# Patient Record
Sex: Male | Born: 1985 | Race: Black or African American | Hispanic: No | Marital: Single | Smoking: Current some day smoker
Health system: Southern US, Community
[De-identification: ages and names within clinical notes are randomized; demographics above are authoritative.]

## PROBLEM LIST (undated history)

## (undated) DIAGNOSIS — F1290 Cannabis use, unspecified, uncomplicated: Secondary | ICD-10-CM

## (undated) DIAGNOSIS — R112 Nausea with vomiting, unspecified: Secondary | ICD-10-CM

## (undated) DIAGNOSIS — R109 Unspecified abdominal pain: Secondary | ICD-10-CM

## (undated) DIAGNOSIS — G8929 Other chronic pain: Secondary | ICD-10-CM

---

## 2010-10-09 ENCOUNTER — Emergency Department (HOSPITAL_COMMUNITY)
Admission: EM | Admit: 2010-10-09 | Discharge: 2010-10-09 | Disposition: A | Discharge status: Home / Self Care | Payer: Self-pay | Attending: Emergency Medicine | Admitting: Emergency Medicine

## 2010-10-09 DIAGNOSIS — G8929 Other chronic pain: Secondary | ICD-10-CM | POA: Insufficient documentation

## 2010-10-09 DIAGNOSIS — R079 Chest pain, unspecified: Secondary | ICD-10-CM | POA: Insufficient documentation

## 2010-10-09 DIAGNOSIS — R109 Unspecified abdominal pain: Secondary | ICD-10-CM | POA: Insufficient documentation

## 2010-11-08 ENCOUNTER — Emergency Department (HOSPITAL_COMMUNITY)
Admission: EM | Admit: 2010-11-08 | Discharge: 2010-11-08 | Disposition: A | Discharge status: Home / Self Care | Payer: Self-pay | Attending: Emergency Medicine | Admitting: Emergency Medicine

## 2010-11-08 DIAGNOSIS — R109 Unspecified abdominal pain: Secondary | ICD-10-CM | POA: Insufficient documentation

## 2010-11-08 DIAGNOSIS — N342 Other urethritis: Secondary | ICD-10-CM | POA: Insufficient documentation

## 2010-11-08 DIAGNOSIS — R112 Nausea with vomiting, unspecified: Secondary | ICD-10-CM | POA: Insufficient documentation

## 2010-11-08 DIAGNOSIS — F411 Generalized anxiety disorder: Secondary | ICD-10-CM | POA: Insufficient documentation

## 2010-11-08 LAB — COMPREHENSIVE METABOLIC PANEL
ALT: 11 U/L (ref 0–53)
AST: 24 U/L (ref 0–37)
Albumin: 4.5 g/dL (ref 3.5–5.2)
Alkaline Phosphatase: 85 U/L (ref 39–117)
CO2: 25 mEq/L (ref 19–32)
Chloride: 100 mEq/L (ref 96–112)
Creatinine, Ser: 0.6 mg/dL (ref 0.50–1.35)
GFR calc non Af Amer: >60 mL/min (ref >60)
Potassium: 4.1 mEq/L (ref 3.5–5.1)
Sodium: 139 mEq/L (ref 135–145)
Total Bilirubin: 1.1 mg/dL (ref 0.3–1.2)

## 2010-11-08 LAB — DIFFERENTIAL
Basophils Absolute: 0 10*3/uL (ref 0.0–0.1)
Basophils Relative: 0 % (ref 0–1)
Eosinophils Absolute: 0 10*3/uL (ref 0.0–0.7)
Lymphocytes Relative: 13 % (ref 12–46)
Monocytes Relative: 11 % (ref 3–12)
Neutro Abs: 9.8 10*3/uL — ABNORMAL HIGH (ref 1.7–7.7)
Neutrophils Relative %: 76 % (ref 43–77)

## 2010-11-08 LAB — URINALYSIS, ROUTINE W REFLEX MICROSCOPIC
Glucose, UA: NEGATIVE mg/dL
Hgb urine dipstick: NEGATIVE
Ketones, ur: 40 mg/dL — AB
Protein, ur: 100 mg/dL — AB
Urobilinogen, UA: 1 mg/dL (ref 0.0–1.0)

## 2010-11-08 LAB — CBC
Hemoglobin: 17.2 g/dL — ABNORMAL HIGH (ref 13.0–17.0)
MCH: 27.6 pg (ref 26.0–34.0)
Platelets: 237 10*3/uL (ref 150–400)
RBC: 6.24 MIL/uL — ABNORMAL HIGH (ref 4.22–5.81)
WBC: 12.9 10*3/uL — ABNORMAL HIGH (ref 4.0–10.5)

## 2010-11-08 LAB — RAPID URINE DRUG SCREEN, HOSP PERFORMED
Amphetamines: NOT DETECTED
Benzodiazepines: NOT DETECTED
Opiates: NOT DETECTED
Tetrahydrocannabinol: POSITIVE — AB

## 2010-11-08 LAB — URINE MICROSCOPIC-ADD ON

## 2010-11-28 ENCOUNTER — Inpatient Hospital Stay (INDEPENDENT_AMBULATORY_CARE_PROVIDER_SITE_OTHER): Admit: 2010-11-28 | Discharge: 2010-11-28 | Disposition: A | Discharge status: Home / Self Care | Payer: Self-pay

## 2010-11-28 ENCOUNTER — Emergency Department (HOSPITAL_COMMUNITY)
Admission: EM | Admit: 2010-11-28 | Discharge: 2010-11-28 | Disposition: A | Discharge status: Home / Self Care | Payer: Self-pay | Attending: Emergency Medicine | Admitting: Emergency Medicine

## 2010-11-28 DIAGNOSIS — K047 Periapical abscess without sinus: Secondary | ICD-10-CM

## 2010-11-28 DIAGNOSIS — K122 Cellulitis and abscess of mouth: Secondary | ICD-10-CM | POA: Insufficient documentation

## 2010-11-29 ENCOUNTER — Emergency Department (HOSPITAL_COMMUNITY): Payer: Self-pay

## 2010-11-29 ENCOUNTER — Emergency Department (HOSPITAL_COMMUNITY)
Admission: EM | Admit: 2010-11-29 | Discharge: 2010-11-30 | Disposition: A | Discharge status: Home / Self Care | Payer: Self-pay | Attending: Emergency Medicine | Admitting: Emergency Medicine

## 2010-11-29 ENCOUNTER — Encounter (HOSPITAL_COMMUNITY): Payer: Self-pay | Admitting: Radiology

## 2010-11-29 DIAGNOSIS — R220 Localized swelling, mass and lump, head: Secondary | ICD-10-CM | POA: Insufficient documentation

## 2010-11-29 DIAGNOSIS — K089 Disorder of teeth and supporting structures, unspecified: Secondary | ICD-10-CM | POA: Insufficient documentation

## 2010-11-29 DIAGNOSIS — K047 Periapical abscess without sinus: Secondary | ICD-10-CM | POA: Insufficient documentation

## 2010-11-29 LAB — BASIC METABOLIC PANEL
CO2: 28 mEq/L (ref 19–32)
Calcium: 9.3 mg/dL (ref 8.4–10.5)
Creatinine, Ser: 0.64 mg/dL (ref 0.50–1.35)
GFR calc non Af Amer: >60 mL/min (ref >60)
Glucose, Bld: 84 mg/dL (ref 70–99)

## 2010-11-29 LAB — CBC
MCH: 27.1 pg (ref 26.0–34.0)
MCHC: 33 g/dL (ref 30.0–36.0)
MCV: 81.9 fL (ref 78.0–100.0)
Platelets: 182 10*3/uL (ref 150–400)
RBC: 5.69 MIL/uL (ref 4.22–5.81)

## 2010-11-29 LAB — DIFFERENTIAL
Eosinophils Absolute: 0.1 10*3/uL (ref 0.0–0.7)
Lymphs Abs: 1.9 10*3/uL (ref 0.7–4.0)
Monocytes Absolute: 1.4 10*3/uL — ABNORMAL HIGH (ref 0.1–1.0)
Monocytes Relative: 13 % — ABNORMAL HIGH (ref 3–12)
Neutrophils Relative %: 67 % (ref 43–77)

## 2010-11-29 MED ORDER — IOHEXOL 300 MG/ML  SOLN
70.0000 mL | Freq: Once | INTRAMUSCULAR | Status: AC | PRN
  Administered 2010-11-29: 70 mL via INTRAVENOUS

## 2011-02-18 ENCOUNTER — Emergency Department (HOSPITAL_COMMUNITY)
Admission: EM | Admit: 2011-02-18 | Discharge: 2011-02-18 | Disposition: A | Discharge status: Home / Self Care | Payer: Self-pay | Attending: Emergency Medicine | Admitting: Emergency Medicine

## 2011-02-18 ENCOUNTER — Encounter (HOSPITAL_COMMUNITY): Payer: Self-pay | Admitting: *Deleted

## 2011-02-18 DIAGNOSIS — R109 Unspecified abdominal pain: Secondary | ICD-10-CM | POA: Insufficient documentation

## 2011-02-18 DIAGNOSIS — R10819 Abdominal tenderness, unspecified site: Secondary | ICD-10-CM | POA: Insufficient documentation

## 2011-02-18 DIAGNOSIS — R110 Nausea: Secondary | ICD-10-CM | POA: Insufficient documentation

## 2011-02-18 LAB — CBC
MCH: 27.7 pg (ref 26.0–34.0)
MCV: 77.9 fL — ABNORMAL LOW (ref 78.0–100.0)
Platelets: 230 10*3/uL (ref 150–400)
RDW: 12.7 % (ref 11.5–15.5)

## 2011-02-18 LAB — DIFFERENTIAL
Basophils Absolute: 0.1 10*3/uL (ref 0.0–0.1)
Basophils Relative: 1 % (ref 0–1)
Eosinophils Absolute: 0 10*3/uL (ref 0.0–0.7)
Eosinophils Relative: 0 % (ref 0–5)

## 2011-02-18 LAB — COMPREHENSIVE METABOLIC PANEL
ALT: 9 U/L (ref 0–53)
Alkaline Phosphatase: 112 U/L (ref 39–117)
BUN: 20 mg/dL (ref 6–23)
GFR calc Af Amer: >90 mL/min (ref >90)
Total Bilirubin: 2 mg/dL — ABNORMAL HIGH (ref 0.3–1.2)

## 2011-02-18 LAB — LACTIC ACID, PLASMA: Lactic Acid, Venous: 1.4 mmol/L (ref 0.5–2.2)

## 2011-02-18 LAB — LIPASE, BLOOD: Lipase: 17 U/L (ref 11–59)

## 2011-02-18 MED ORDER — HYDROMORPHONE HCL PF 2 MG/ML IJ SOLN
2.0000 mg | Freq: Once | INTRAMUSCULAR | Status: AC
  Administered 2011-02-18: 2 mg via INTRAMUSCULAR

## 2011-02-18 MED ORDER — HYDROMORPHONE HCL PF 2 MG/ML IJ SOLN
2.0000 mg | Freq: Once | INTRAMUSCULAR | Status: AC
  Administered 2011-02-18: 2 mg via INTRAMUSCULAR
  Filled 2011-02-18: qty 1

## 2011-02-18 MED ORDER — ONDANSETRON 4 MG PO TBDP
4.0000 mg | ORAL_TABLET | Freq: Once | ORAL | Status: AC
  Administered 2011-02-18: 4 mg via ORAL
  Filled 2011-02-18: qty 1

## 2011-02-18 MED ORDER — ONDANSETRON HCL 4 MG PO TABS: 8.0000 mg | ORAL_TABLET | Freq: Two times a day (BID) | ORAL | Status: AC | PRN

## 2011-02-18 MED ORDER — ONDANSETRON HCL 4 MG/2ML IJ SOLN
INTRAMUSCULAR | Status: AC
  Administered 2011-02-18: 4 mg via INTRAMUSCULAR
  Filled 2011-02-18: qty 2

## 2011-02-18 MED ORDER — HYDROCODONE-ACETAMINOPHEN 5-325 MG PO TABS: 2.0000 | ORAL_TABLET | ORAL | Status: AC | PRN

## 2011-02-18 MED ORDER — HYDROMORPHONE HCL PF 2 MG/ML IJ SOLN
2.0000 mg | Freq: Once | INTRAMUSCULAR | Status: DC
  Filled 2011-02-18: qty 1

## 2011-02-18 MED ORDER — ONDANSETRON 8 MG PO TBDP
8.0000 mg | ORAL_TABLET | Freq: Once | ORAL | Status: AC
  Administered 2011-02-18: 8 mg via ORAL
  Filled 2011-02-18: qty 1

## 2011-02-18 NOTE — ED Notes (Signed)
Per EMS, pt c/o lower abdominal pain and states has been having these episodes for 10 years.

## 2011-02-18 NOTE — ED Provider Notes (Signed)
History     CSN: [id number] Arrival date & time: 02/18/2011  5:28 PM   First MD Initiated Contact with Patient 02/18/11 1809      Chief Complaint  Patient presents with  . Abdominal Pain    (Consider location/radiation/quality/duration/timing/severity/associated sxs/prior treatment) HPI The patient presents with 5 days of abdominal pain, nausea. He notes a history of greater than 10 years of similar pain. This episode began gradually, since onset has been progressive. The pain is diffuse, abdominal, crampy. Pain is not relieved by anything, the patient is now intolerant of by mouth, both medications and food. The pain is worse with motion and activity. No dyspnea, no chest pain, no dysuria, no fever, no chills. The patient notes ER visits every 2-3 months for similar pain. Past Medical History  Diagnosis Date  . Abdominal pain     History reviewed. No pertinent past surgical history.  No family history on file.  History  Substance Use Topics  . Smoking status: Not on file  . Smokeless tobacco: Not on file  . Alcohol Use:       Review of Systems Gen: Per HPI HEENT: No HA CV: No CP Resp: No dyspnea Abd: Per HPI, otherwise negative Musk: Per HPI, otherwise negative Neuro: No dysesthesia, or focal changes GU: Per HPI, otherwise negative Skin: Neg Psych: Neg  Allergies  Morphine and related; Vicodin; and Protonix  Home Medications  No current outpatient prescriptions on file.  BP 170/89  Pulse 79  Temp(Src) 98.7 F (37.1 C) (Oral)  Resp 16  SpO2 100%  Physical Exam  Constitutional: He is oriented to person, place, and time. He appears well-developed and well-nourished. He appears distressed.  HENT:  Head: Normocephalic and atraumatic.  Eyes: Conjunctivae are normal. Pupils are equal, round, and reactive to light.  Neck: Neck supple.  Cardiovascular: Normal rate and regular rhythm.   Pulmonary/Chest: Effort normal and breath sounds normal.  Abdominal:  Soft. Normal appearance. There is generalized tenderness. There is guarding. There is no rigidity.  Musculoskeletal: He exhibits no edema and no tenderness.  Neurological: He is alert and oriented to person, place, and time. No cranial nerve deficit.  Skin: Skin is warm and dry.  Psychiatric: He has a normal mood and affect.    ED Course  Procedures (including critical care time)   Labs Reviewed  CBC  DIFFERENTIAL  LACTIC ACID, PLASMA  COMPREHENSIVE METABOLIC PANEL  LIPASE, BLOOD   No results found.   No diagnosis found.    MDM  This 25 year old male with history of chronic abdominal pain now presents with worsening pain for the past several days. On exam the patient is uncomfortable appearing. Following several boluses of IM narcotics, the patient was significantly more comfortable.  The patient's laboratory evaluation, does not demonstrate acute findings. Given his absence of acute findings, patient's stable vital signs, and is significant pertinent with analgesics, this presentation is most consistent with acute on chronic pain The patient will be discharged to continue followup with his specialists.        Gerhard Munch, MD 02/18/11 2221

## 2011-02-18 NOTE — ED Notes (Signed)
JYN:[id number]<AO> Expected date:02/18/11<BR> Expected time: 5:20 PM<BR> Means of arrival:Ambulance<BR> Comments:<BR> EMS 10 GC : abd pain

## 2011-02-21 ENCOUNTER — Emergency Department (HOSPITAL_COMMUNITY): Payer: Self-pay

## 2011-02-21 ENCOUNTER — Encounter (HOSPITAL_COMMUNITY): Payer: Self-pay

## 2011-02-21 ENCOUNTER — Emergency Department (HOSPITAL_COMMUNITY)
Admission: EM | Admit: 2011-02-21 | Discharge: 2011-02-21 | Disposition: A | Discharge status: Home / Self Care | Payer: Self-pay | Attending: Emergency Medicine | Admitting: Emergency Medicine

## 2011-02-21 DIAGNOSIS — N390 Urinary tract infection, site not specified: Secondary | ICD-10-CM | POA: Insufficient documentation

## 2011-02-21 DIAGNOSIS — R109 Unspecified abdominal pain: Secondary | ICD-10-CM | POA: Insufficient documentation

## 2011-02-21 DIAGNOSIS — R112 Nausea with vomiting, unspecified: Secondary | ICD-10-CM | POA: Insufficient documentation

## 2011-02-21 DIAGNOSIS — R10819 Abdominal tenderness, unspecified site: Secondary | ICD-10-CM | POA: Insufficient documentation

## 2011-02-21 LAB — DIFFERENTIAL
Basophils Absolute: 0.1 10*3/uL (ref 0.0–0.1)
Basophils Relative: 1 % (ref 0–1)
Eosinophils Absolute: 0 10*3/uL (ref 0.0–0.7)
Eosinophils Relative: 0 % (ref 0–5)
Monocytes Absolute: 1.2 10*3/uL — ABNORMAL HIGH (ref 0.1–1.0)

## 2011-02-21 LAB — LACTIC ACID, PLASMA: Lactic Acid, Venous: 1.7 mmol/L (ref 0.5–2.2)

## 2011-02-21 LAB — COMPREHENSIVE METABOLIC PANEL
ALT: 8 U/L (ref 0–53)
AST: 14 U/L (ref 0–37)
CO2: 29 mEq/L (ref 19–32)
Calcium: 10.2 mg/dL (ref 8.4–10.5)
Creatinine, Ser: 0.78 mg/dL (ref 0.50–1.35)
GFR calc non Af Amer: >90 mL/min (ref >90)
Sodium: 133 mEq/L — ABNORMAL LOW (ref 135–145)
Total Protein: 9 g/dL — ABNORMAL HIGH (ref 6.0–8.3)

## 2011-02-21 LAB — URINALYSIS, ROUTINE W REFLEX MICROSCOPIC
Glucose, UA: NEGATIVE mg/dL
Hgb urine dipstick: NEGATIVE
Protein, ur: 100 mg/dL — AB
Specific Gravity, Urine: 1.035 — ABNORMAL HIGH (ref 1.005–1.030)
Urobilinogen, UA: 1 mg/dL (ref 0.0–1.0)

## 2011-02-21 LAB — CBC
HCT: 57.1 % — ABNORMAL HIGH (ref 39.0–52.0)
MCH: 27.6 pg (ref 26.0–34.0)
MCHC: 35.7 g/dL (ref 30.0–36.0)
MCV: 77.4 fL — ABNORMAL LOW (ref 78.0–100.0)
Platelets: 225 10*3/uL (ref 150–400)
RDW: 12.5 % (ref 11.5–15.5)
WBC: 8 10*3/uL (ref 4.0–10.5)

## 2011-02-21 LAB — URINE MICROSCOPIC-ADD ON

## 2011-02-21 MED ORDER — HYDROMORPHONE HCL PF 1 MG/ML IJ SOLN
1.0000 mg | Freq: Once | INTRAMUSCULAR | Status: AC
  Administered 2011-02-21: 1 mg via INTRAVENOUS

## 2011-02-21 MED ORDER — CEFTRIAXONE SODIUM 1 G IJ SOLR
1.0000 g | Freq: Once | INTRAMUSCULAR | Status: AC
  Administered 2011-02-21: 0.25 g via INTRAMUSCULAR
  Filled 2011-02-21: qty 10

## 2011-02-21 MED ORDER — IOHEXOL 300 MG/ML  SOLN
100.0000 mL | Freq: Once | INTRAMUSCULAR | Status: AC | PRN
  Administered 2011-02-21: 100 mL via INTRAVENOUS

## 2011-02-21 MED ORDER — DROPERIDOL 2.5 MG/ML IJ SOLN: 2.5000 mg | Freq: Once | INTRAMUSCULAR | Status: DC

## 2011-02-21 MED ORDER — METOCLOPRAMIDE HCL 5 MG/ML IJ SOLN
10.0000 mg | Freq: Once | INTRAMUSCULAR | Status: AC
  Administered 2011-02-21: 10 mg via INTRAVENOUS
  Filled 2011-02-21: qty 2

## 2011-02-21 MED ORDER — HYDROMORPHONE HCL PF 2 MG/ML IJ SOLN
INTRAMUSCULAR | Status: AC
  Filled 2011-02-21: qty 1

## 2011-02-21 MED ORDER — FENTANYL CITRATE 0.05 MG/ML IJ SOLN
100.0000 ug | Freq: Once | INTRAMUSCULAR | Status: AC
  Administered 2011-02-21: 100 ug via INTRAVENOUS
  Filled 2011-02-21: qty 2

## 2011-02-21 MED ORDER — AZITHROMYCIN 250 MG PO TABS
1000.0000 mg | ORAL_TABLET | Freq: Once | ORAL | Status: AC
  Administered 2011-02-21: 1000 mg via ORAL
  Filled 2011-02-21: qty 1

## 2011-02-21 MED ORDER — CEPHALEXIN 500 MG PO CAPS: 500.0000 mg | ORAL_CAPSULE | Freq: Four times a day (QID) | ORAL | Status: AC

## 2011-02-21 MED ORDER — SODIUM CHLORIDE 0.9 % IV BOLUS (SEPSIS)
1000.0000 mL | Freq: Once | INTRAVENOUS | Status: AC
  Administered 2011-02-21: 1000 mL via INTRAVENOUS

## 2011-02-21 MED ORDER — ONDANSETRON HCL 4 MG/2ML IJ SOLN
4.0000 mg | Freq: Once | INTRAMUSCULAR | Status: AC
  Administered 2011-02-21: 4 mg via INTRAVENOUS
  Filled 2011-02-21: qty 2

## 2011-02-21 MED ORDER — LIDOCAINE HCL 1 % IJ SOLN
INTRAMUSCULAR | Status: AC
  Administered 2011-02-21: 20 mL
  Filled 2011-02-21: qty 20

## 2011-02-21 NOTE — ED Notes (Signed)
Patient transported to CT 

## 2011-02-21 NOTE — ED Notes (Signed)
ZOX:[id number]<AV> Expected date:02/21/11<BR> Expected time: 6:14 AM<BR> Means of arrival:Ambulance<BR> Comments:<BR>

## 2011-02-21 NOTE — ED Notes (Signed)
Attempted twice to collect urine. Patient states "I'm in too much pain right now. The pain medicine didn't work." Occupational hygienist, [place].

## 2011-02-21 NOTE — ED Provider Notes (Signed)
Medical screening examination/treatment/procedure(s) were performed by non-physician practitioner and as supervising physician I was immediately available for consultation/collaboration.   Glynn Octave, MD 02/21/11 [phone]

## 2011-02-21 NOTE — ED Notes (Signed)
Pt with c/o abd pain since last Monday. Pt reports n/v, denies diarrhea. Pt was seen here on Saturday for same and d/c with rx for Phenergan and Vicodin. Pt states the Phenergan makes him feel worse, and the Vicodin is not relieving his pain. Pt states he has had this problem for the past 10 years and no one has been able to diagnose his pain.

## 2011-02-21 NOTE — ED Provider Notes (Signed)
History     CSN: [id number] Arrival date & time: 02/21/2011  6:38 AM   First MD Initiated Contact with Patient 02/21/11 [phone]      Chief Complaint  Patient presents with  . Abdominal Pain    Pt with c/o abd pain since last Monday. Was seen here on Saturday for same. Reports n/v   HPI Patient presents to the emergency room with complaint of abdominal pain that is diffuse in nature. Patient reports that he has associated nausea and vomiting. States that he has been unable to eat anything for one week. Patient states that   Past Medical History  Diagnosis Date  . Abdominal pain     History reviewed. No pertinent past surgical history.  No family history on file.  History  Substance Use Topics  . Smoking status: Never Smoker   . Smokeless tobacco: Not on file  . Alcohol Use: No      Review of Systems  Constitutional: Negative for fever, chills, diaphoresis and appetite change.  HENT: Negative for neck pain.   Eyes: Negative for photophobia and visual disturbance.  Respiratory: Negative for cough, chest tightness and shortness of breath.   Cardiovascular: Negative for chest pain.  Gastrointestinal: Negative for nausea, vomiting and abdominal pain.  Genitourinary: Negative for flank pain.  Musculoskeletal: Negative for back pain.  Skin: Negative for rash.  Neurological: Negative for weakness and numbness.  All other systems reviewed and are negative.    Allergies  Morphine and related; Vicodin; and Protonix  Home Medications   Current Outpatient Rx  Name Route Sig Dispense Refill  . HYDROCODONE-ACETAMINOPHEN 5-325 MG PO TABS Oral Take 2 tablets by mouth every 4 (four) hours as needed for pain. 15 tablet 0  . ONDANSETRON HCL 4 MG PO TABS Oral Take 2 tablets (8 mg total) by mouth every 12 (twelve) hours as needed for nausea. 15 tablet 0    BP 134/84  Pulse 99  Temp(Src) 99.2 F (37.3 C) (Oral)  Resp 24  SpO2 100%  Physical Exam  Nursing note and vitals  reviewed. Constitutional: He is oriented to person, place, and time. He appears well-developed and well-nourished. No distress.  HENT:  Head: Normocephalic and atraumatic.  Eyes: EOM are normal. Pupils are equal, round, and reactive to light.  Neck: Normal range of motion. Neck supple.  Cardiovascular: Normal rate, regular rhythm, normal heart sounds and intact distal pulses.  Exam reveals no gallop and no friction rub.   No murmur heard. Abdominal: Soft. Bowel sounds are normal. He exhibits no distension and no mass. There is tenderness in the suprapubic area. There is guarding. There is no rigidity, no rebound, no CVA tenderness, no tenderness at McBurney's point and negative Murphy's sign. No hernia.  Genitourinary: Penis normal.       Chaperone present during examination.  Musculoskeletal: Normal range of motion.  Neurological: He is alert and oriented to person, place, and time.  Skin: Skin is warm and dry. No rash noted. He is not diaphoretic. No erythema. No pallor.    ED Course  Procedures (including critical care time)  Patient seen and evaluated.  VSS reviewed. . Nursing notes reviewed. Discussed with attending physician, Dr. Manus Gunning. Initial testing ordered. Will monitor the patient closely. They agree with the treatment plan and diagnosis.   Results for orders placed during the hospital encounter of 02/21/11  CBC      Component Value Range   WBC 8.0  4.0 - 10.5 (K/uL)   RBC  7.38 (*) 4.22 - 5.81 (MIL/uL)   Hemoglobin 20.4 (*) 13.0 - 17.0 (g/dL)   HCT 16.1 (*) 09.6 - 52.0 (%)   MCV 77.4 (*) 78.0 - 100.0 (fL)   MCH 27.6  26.0 - 34.0 (pg)   MCHC 35.7  30.0 - 36.0 (g/dL)   RDW 04.5  40.9 - 81.1 (%)   Platelets 225  150 - 400 (K/uL)  DIFFERENTIAL      Component Value Range   Neutrophils Relative 61  43 - 77 (%)   Neutro Abs 4.9  1.7 - 7.7 (K/uL)   Lymphocytes Relative 23  12 - 46 (%)   Lymphs Abs 1.9  0.7 - 4.0 (K/uL)   Monocytes Relative 14 (*) 3 - 12 (%)   Monocytes  Absolute 1.2 (*) 0.1 - 1.0 (K/uL)   Eosinophils Relative 0  0 - 5 (%)   Eosinophils Absolute 0.0  0.0 - 0.7 (K/uL)   Basophils Relative 1  0 - 1 (%)   Basophils Absolute 0.1  0.0 - 0.1 (K/uL)  COMPREHENSIVE METABOLIC PANEL      Component Value Range   Sodium 133 (*) 135 - 145 (mEq/L)   Potassium 3.4 (*) 3.5 - 5.1 (mEq/L)   Chloride 89 (*) 96 - 112 (mEq/L)   CO2 29  19 - 32 (mEq/L)   Glucose, Bld 113 (*) 70 - 99 (mg/dL)   BUN 20  6 - 23 (mg/dL)   Creatinine, Ser 9.14  0.50 - 1.35 (mg/dL)   Calcium 78.2  8.4 - 10.5 (mg/dL)   Total Protein 9.0 (*) 6.0 - 8.3 (g/dL)   Albumin 4.8  3.5 - 5.2 (g/dL)   AST 14  0 - 37 (U/L)   ALT 8  0 - 53 (U/L)   Alkaline Phosphatase 112  39 - 117 (U/L)   Total Bilirubin 2.7 (*) 0.3 - 1.2 (mg/dL)   GFR calc non Af Amer >90  >90 (mL/min)   GFR calc Af Amer >90  >90 (mL/min)  LIPASE, BLOOD      Component Value Range   Lipase 21  11 - 59 (U/L)  URINALYSIS, ROUTINE W REFLEX MICROSCOPIC      Component Value Range   Color, Urine AMBER (*) YELLOW    Appearance CLEAR  CLEAR    Specific Gravity, Urine 1.035 (*) 1.005 - 1.030    pH 6.5  5.0 - 8.0    Glucose, UA NEGATIVE  NEGATIVE (mg/dL)   Hgb urine dipstick NEGATIVE  NEGATIVE    Bilirubin Urine SMALL (*) NEGATIVE    Ketones, ur >80 (*) NEGATIVE (mg/dL)   Protein, ur 956 (*) NEGATIVE (mg/dL)   Urobilinogen, UA 1.0  0.0 - 1.0 (mg/dL)   Nitrite NEGATIVE  NEGATIVE    Leukocytes, UA SMALL (*) NEGATIVE   LACTIC ACID, PLASMA      Component Value Range   Lactic Acid, Venous 1.7  0.5 - 2.2 (mmol/L)  URINE MICROSCOPIC-ADD ON      Component Value Range   Squamous Epithelial / LPF FEW (*) RARE    WBC, UA 7-10  <3 (WBC/hpf)   Bacteria, UA FEW (*) RARE    Urine-Other MUCOUS PRESENT     Ct Abdomen Pelvis W Contrast  02/21/2011  *RADIOLOGY REPORT*  Clinical Data: Diffuse mid and lower abdominal pain with nausea and vomiting.  CT ABDOMEN AND PELVIS WITH CONTRAST  Technique:  Multidetector CT imaging of the abdomen  and pelvis was performed following the standard protocol during bolus administration  of intravenous contrast.  Contrast: OMNIPAQUE IOHEXOL 300 MG/ML IV SOLN  Comparison: None.  Findings: The liver, gallbladder, spleen, pancreas, adrenal glands and kidneys are within normal limits.  Bowel loops are normal in caliber.  No acute inflammatory process is identified.  There is no evidence of appendicitis.  No abnormal fluid collections.  No hernias or abnormal calcifications.  The bladder is unremarkable.  Bony structures are unremarkable.  There are incidental mild posterior disc bulges at the L4-5 and L5-S1 levels of the lumbar spine.  IMPRESSION: No acute abnormalities.  Original Report Authenticated By: Reola Calkins, M.D.        7:25 AM spoke to dr Newman Nip, from [place] who states that he has had chronic abdominal pain for many years. Reports that he has had endoscopy/colonscopy/many CT scans of abdomen. Last CT scan was in May 2011.   Patient seen and evaluated.  VSS reviewed. . Nursing notes reviewed. Discussed with attending physician, Dr. Manus Gunning. Advised that the patient should have a CT scan. Initial testing ordered. Will monitor the patient closely. They agree with the treatment plan and diagnosis.    8:34 AM pain is not controlled with fentanyl, states that he has taken dilaudid without problems. Patient was standing in re-examination and in NAD.   10:08 AM went to re-evaluate the patient, he is currently in CT scan. Pending results.   11:38 AM will treat patient for STD/UTI considering the symptomatic UTI. Pain has resolved. Passed PO fluid challenge.  MDM  Abdominal pain        Demetrius Charity, [place] 02/21/11 1139

## 2011-02-21 NOTE — ED Notes (Signed)
Chief Complaint  Patient presents with  . Abdominal Pain    Pt with c/o abd pain since last Monday. Was seen here on Saturday for same. Reports n/v

## 2011-04-28 ENCOUNTER — Encounter (HOSPITAL_COMMUNITY): Payer: Self-pay | Admitting: *Deleted

## 2011-04-28 ENCOUNTER — Emergency Department (HOSPITAL_COMMUNITY)
Admission: EM | Admit: 2011-04-28 | Discharge: 2011-04-28 | Disposition: A | Discharge status: Home / Self Care | Payer: Self-pay | Attending: Emergency Medicine | Admitting: Emergency Medicine

## 2011-04-28 DIAGNOSIS — R109 Unspecified abdominal pain: Secondary | ICD-10-CM | POA: Insufficient documentation

## 2011-04-28 DIAGNOSIS — G8929 Other chronic pain: Secondary | ICD-10-CM | POA: Insufficient documentation

## 2011-04-28 DIAGNOSIS — R112 Nausea with vomiting, unspecified: Secondary | ICD-10-CM | POA: Insufficient documentation

## 2011-04-28 LAB — COMPREHENSIVE METABOLIC PANEL
ALT: 7 U/L (ref 0–53)
AST: 13 U/L (ref 0–37)
Albumin: 4.4 g/dL (ref 3.5–5.2)
Alkaline Phosphatase: 63 U/L (ref 39–117)
BUN: 8 mg/dL (ref 6–23)
CO2: 27 mEq/L (ref 19–32)
Calcium: 9.7 mg/dL (ref 8.4–10.5)
Chloride: 104 mEq/L (ref 96–112)
Creatinine, Ser: 0.75 mg/dL (ref 0.50–1.35)
GFR calc Af Amer: >90 mL/min (ref >90)
GFR calc non Af Amer: >90 mL/min (ref >90)
Glucose, Bld: 90 mg/dL (ref 70–99)
Potassium: 3.7 mEq/L (ref 3.5–5.1)
Sodium: 141 mEq/L (ref 135–145)
Total Bilirubin: 1 mg/dL (ref 0.3–1.2)
Total Protein: 7.4 g/dL (ref 6.0–8.3)

## 2011-04-28 LAB — URINALYSIS, ROUTINE W REFLEX MICROSCOPIC
Hgb urine dipstick: NEGATIVE
Ketones, ur: 15 mg/dL — AB
Specific Gravity, Urine: 1.027 (ref 1.005–1.030)
pH: 7 (ref 5.0–8.0)

## 2011-04-28 LAB — CBC
MCHC: 33.7 g/dL (ref 30.0–36.0)
Platelets: 196 10*3/uL (ref 150–400)
RDW: 13.3 % (ref 11.5–15.5)

## 2011-04-28 LAB — DIFFERENTIAL
Basophils Absolute: 0.1 10*3/uL (ref 0.0–0.1)
Basophils Relative: 1 % (ref 0–1)
Monocytes Absolute: 0.6 10*3/uL (ref 0.1–1.0)
Neutro Abs: 2.7 10*3/uL (ref 1.7–7.7)
Neutrophils Relative %: 50 % (ref 43–77)

## 2011-04-28 LAB — LIPASE, BLOOD: Lipase: 16 U/L (ref 11–59)

## 2011-04-28 MED ORDER — SODIUM CHLORIDE 0.9 % IV BOLUS (SEPSIS)
1000.0000 mL | Freq: Once | INTRAVENOUS | Status: AC
  Administered 2011-04-28: 1000 mL via INTRAVENOUS

## 2011-04-28 MED ORDER — HYDROMORPHONE HCL PF 1 MG/ML IJ SOLN
1.0000 mg | Freq: Once | INTRAMUSCULAR | Status: AC
  Administered 2011-04-28: 1 mg via INTRAVENOUS
  Filled 2011-04-28: qty 1

## 2011-04-28 MED ORDER — ONDANSETRON HCL 4 MG PO TABS: 4.0000 mg | ORAL_TABLET | Freq: Four times a day (QID) | ORAL | Status: AC

## 2011-04-28 MED ORDER — ONDANSETRON HCL 4 MG/2ML IJ SOLN
4.0000 mg | Freq: Once | INTRAMUSCULAR | Status: AC
  Administered 2011-04-28: 4 mg via INTRAVENOUS
  Filled 2011-04-28: qty 2

## 2011-04-28 MED ORDER — OXYCODONE-ACETAMINOPHEN 5-325 MG PO TABS: 1.0000 | ORAL_TABLET | ORAL | Status: AC | PRN

## 2011-04-28 NOTE — ED Notes (Signed)
Pt noted alert and oriented. S/O at bedside. Skin warm and dry. VSS. Resp are unlabored. SR up. Call bell in reach.

## 2011-04-28 NOTE — ED Notes (Signed)
Pt reports pain improved 4/10. PO trial given with water.  Pt appears in no acute distress.

## 2011-04-28 NOTE — ED Notes (Signed)
The pt hasha d abd and chest pain for 9 years.  He has had more pain for 4 weeks.  He does not live here he lives in [place] and his doxtors are there.  There is no diagnosis yet

## 2011-04-28 NOTE — ED Notes (Signed)
Pt tolerated po trial. Feels well enough to go home.

## 2011-04-28 NOTE — ED Provider Notes (Signed)
History     CSN: [id number]  Arrival date & time 04/28/11  1639   First MD Initiated Contact with Patient 04/28/11 1802      Chief Complaint  Patient presents with  . Abdominal Pain    (Consider location/radiation/quality/duration/timing/severity/associated sxs/prior treatment) Patient is a 26 y.o. male presenting with abdominal pain. The history is provided by the patient.  Abdominal Pain The primary symptoms of the illness include abdominal pain, nausea and vomiting. The primary symptoms of the illness do not include fever, shortness of breath or diarrhea. Episode onset: 3 weeks.  acute on chronic. The onset of the illness was gradual. The problem has been gradually worsening.  Onset: 3 weeks. The pain came on gradually. The abdominal pain has been gradually worsening since its onset. Pain Location: Lower abdomen. The abdominal pain does not radiate. Pain scale: Severe. The abdominal pain is relieved by nothing. The abdominal pain is exacerbated by vomiting.  The patient has not had a change in bowel habit. Additional symptoms associated with the illness include anorexia. Symptoms associated with the illness do not include diaphoresis or constipation.    Past Medical History  Diagnosis Date  . Abdominal pain     History reviewed. No pertinent past surgical history.  No family history on file.  History  Substance Use Topics  . Smoking status: Never Smoker   . Smokeless tobacco: Not on file  . Alcohol Use: No      Review of Systems  Constitutional: Negative for fever and diaphoresis.  HENT: Negative for congestion, facial swelling and trouble swallowing.   Respiratory: Negative for cough and shortness of breath.   Cardiovascular: Negative for chest pain.  Gastrointestinal: Positive for nausea, vomiting, abdominal pain and anorexia. Negative for diarrhea and constipation.  Genitourinary: Negative for difficulty urinating.  Skin: Negative for rash.  All other systems  reviewed and are negative.    Allergies  Morphine and related; Vicodin; and Protonix  Home Medications  No current outpatient prescriptions on file.  BP 134/80  Pulse 64  Temp 98.7 F (37.1 C)  Resp 18  SpO2 98%  Physical Exam  Nursing note and vitals reviewed. Constitutional: He is oriented to person, place, and time. He appears well-developed and well-nourished. No distress.  HENT:  Head: Normocephalic and atraumatic.  Mouth/Throat: Oropharynx is clear and moist.  Eyes: Conjunctivae are normal. Pupils are equal, round, and reactive to light. No scleral icterus.  Neck: Normal range of motion. Neck supple.  Cardiovascular: Normal rate, regular rhythm, normal heart sounds and intact distal pulses.   No murmur heard. Pulmonary/Chest: Effort normal and breath sounds normal. No stridor. No respiratory distress. He has no wheezes. He has no rales.  Abdominal: Soft. He exhibits no distension. There is tenderness in the right lower quadrant, periumbilical area and left lower quadrant.  Musculoskeletal: Normal range of motion. He exhibits no edema.  Neurological: He is alert and oriented to person, place, and time.  Skin: Skin is warm and dry. No rash noted.  Psychiatric: He has a normal mood and affect. His behavior is normal.    ED Course  Procedures (including critical care time)  Labs Reviewed  URINALYSIS, ROUTINE W REFLEX MICROSCOPIC - Abnormal; Notable for the following:    Bilirubin Urine SMALL (*)    Ketones, ur 15 (*)    All other components within normal limits  CBC - Abnormal; Notable for the following:    RBC 5.88 (*)    All other components within normal  limits  COMPREHENSIVE METABOLIC PANEL  LIPASE, BLOOD  DIFFERENTIAL   No results found.   1. Abdominal pain       MDM  26 year old male presenting with acute on chronic abdominal pain. He states that he has been having abdominal pain for the past 9 years. He has been worked up for this abdominal pain  significantly by multiple specialists. This episode started approximately 3 weeks ago and has persisted without relief. He complains primarily of lower abdominal pain nausea and vomiting. He also complains of chest pain, which he describes as a nauseous feeling, not true chest pain. He describes this episode as typical of his chronic flare ups.  He has been seen at this institution for this problem in the past.  An abdominal CT scan was performed two months ago for a similar presentation and showed no acute abnormalities.  Will send labwork and treat symptomatically with IV Zofran, Dilaudid, and fluids.  Will hold off on imaging pending results of labwork.  Labwork unremarkable.  Pain and nausea improved with IV dilaudid and zofran.  He tolerated PO fluids in ED.  DC'd home with return precautions.          Warnell Forester, MD 04/28/11 2210

## 2011-04-29 NOTE — ED Provider Notes (Signed)
I saw and evaluated the patient, reviewed the resident's note and I agree with the findings and plan. Chronic intermittent abdominal pain x > 10 years with previous mult CT A/P, endoscopy/colonoscopy inpt/outpatient w/u [facility], [place] without obvious cause. Possible gastroparesis per patient. Unable to tolerate PO prior to arrival. Abd benign. VSS. Pain controlled after dilaudid. Labs unremarkable. Tolerating PO. Will discharge home with PMD f/u.   Forbes Cellar, MD 04/29/11 [phone]

## 2011-05-14 ENCOUNTER — Encounter (HOSPITAL_COMMUNITY): Payer: Self-pay | Admitting: Emergency Medicine

## 2011-05-14 ENCOUNTER — Emergency Department (HOSPITAL_COMMUNITY)
Admission: EM | Admit: 2011-05-14 | Discharge: 2011-05-15 | Disposition: A | Discharge status: Home / Self Care | Payer: Self-pay | Attending: Emergency Medicine | Admitting: Emergency Medicine

## 2011-05-14 DIAGNOSIS — G8929 Other chronic pain: Secondary | ICD-10-CM | POA: Insufficient documentation

## 2011-05-14 DIAGNOSIS — R109 Unspecified abdominal pain: Secondary | ICD-10-CM | POA: Insufficient documentation

## 2011-05-14 DIAGNOSIS — R1115 Cyclical vomiting syndrome unrelated to migraine: Secondary | ICD-10-CM | POA: Insufficient documentation

## 2011-05-14 LAB — ETHANOL: Alcohol, Ethyl (B): <11 mg/dL (ref 0–11)

## 2011-05-14 LAB — COMPREHENSIVE METABOLIC PANEL
ALT: 5 U/L (ref 0–53)
Albumin: 5 g/dL (ref 3.5–5.2)
Alkaline Phosphatase: 73 U/L (ref 39–117)
Calcium: 10.3 mg/dL (ref 8.4–10.5)
Potassium: 3.6 mEq/L (ref 3.5–5.1)
Sodium: 137 mEq/L (ref 135–145)
Total Protein: 8.4 g/dL — ABNORMAL HIGH (ref 6.0–8.3)

## 2011-05-14 LAB — DIFFERENTIAL
Basophils Absolute: 0 10*3/uL (ref 0.0–0.1)
Basophils Relative: 1 % (ref 0–1)
Eosinophils Absolute: 0 10*3/uL (ref 0.0–0.7)
Neutrophils Relative %: 62 % (ref 43–77)

## 2011-05-14 LAB — CBC
MCH: 27.8 pg (ref 26.0–34.0)
MCHC: 34.5 g/dL (ref 30.0–36.0)
Platelets: 223 10*3/uL (ref 150–400)
RBC: 6.59 MIL/uL — ABNORMAL HIGH (ref 4.22–5.81)
RDW: 13 % (ref 11.5–15.5)

## 2011-05-14 MED ORDER — HYDROMORPHONE HCL PF 1 MG/ML IJ SOLN
1.0000 mg | Freq: Once | INTRAMUSCULAR | Status: AC
  Administered 2011-05-15: 1 mg via INTRAVENOUS
  Filled 2011-05-14: qty 1

## 2011-05-14 MED ORDER — SODIUM CHLORIDE 0.9 % IV BOLUS (SEPSIS)
1000.0000 mL | Freq: Once | INTRAVENOUS | Status: AC
  Administered 2011-05-14: 1000 mL via INTRAVENOUS

## 2011-05-14 MED ORDER — IOHEXOL 300 MG/ML  SOLN
20.0000 mL | INTRAMUSCULAR | Status: AC
  Administered 2011-05-15 (×2): 20 mL via ORAL

## 2011-05-14 MED ORDER — DROPERIDOL 2.5 MG/ML IJ SOLN
1.2500 mg | INTRAMUSCULAR | Status: AC
  Administered 2011-05-15: 1.25 mg via INTRAVENOUS
  Filled 2011-05-14: qty 0.5

## 2011-05-14 MED ORDER — HYDROMORPHONE HCL PF 1 MG/ML IJ SOLN
1.0000 mg | Freq: Once | INTRAMUSCULAR | Status: AC
  Administered 2011-05-14: 1 mg via INTRAVENOUS
  Filled 2011-05-14: qty 1

## 2011-05-14 MED ORDER — ONDANSETRON HCL 4 MG/2ML IJ SOLN
4.0000 mg | Freq: Once | INTRAMUSCULAR | Status: AC
  Administered 2011-05-14: 4 mg via INTRAVENOUS
  Filled 2011-05-14: qty 2

## 2011-05-14 NOTE — ED Notes (Addendum)
C/o chronic abd pain with acute exacerbation, diffuse, constant, no aggravating or aleviating factors, no meds PTA, ongoing for last month, h/o same for 9 years, also reports nv and low grade fever & chills, denies bleeding, last BM 4d ago, last ate 4d ago, last emesis 10 minutes ago. Denies diarrhea, h/o surgeries or known dx, (followed for same at [facility]).

## 2011-05-14 NOTE — ED Provider Notes (Signed)
History     CSN: [id number]  Arrival date & time 05/14/11  2109   First MD Initiated Contact with Patient 05/14/11 2156      Chief Complaint  Patient presents with  . Abdominal Pain    (Consider location/radiation/quality/duration/timing/severity/associated sxs/prior treatment) HPI  Patient who has Chronic intermittent abdominal pain x > 10 years with previous multi CT A/P, endoscopy/colonoscopy inpt/outpatient w/u [facility], [place], [facility] without obvious cause but pssible gastroparesis per patient who was recently seen in ER returns to ER complaining of a 3 day hx of uncontrollable vomiting and ongoing chronic abdominal pain. Patient denies hx of abdominal surgery. Patient denies aggravating or alleviating factors stating pain is constant, severe and unchanging. Patient states he does not have daily pain meds but is followed by a PCP in [facility]. Patient took nothing for pain PTA. Pain is consistent with 9-10 years of chronic pan but states pain is aggravating by intractable vomiting.    Past Medical History  Diagnosis Date  . Abdominal pain     History reviewed. No pertinent past surgical history.  History reviewed. No pertinent family history.  History  Substance Use Topics  . Smoking status: Never Smoker   . Smokeless tobacco: Not on file  . Alcohol Use: No      Review of Systems  All other systems reviewed and are negative.    Allergies  Morphine and related; Tramadol; Vicodin; and Protonix  Home Medications  No current outpatient prescriptions on file.  BP 122/79  Pulse 85  Temp(Src) 98.7 F (37.1 C) (Oral)  Resp 18  SpO2 98%  Physical Exam  Nursing note and vitals reviewed. Constitutional: He is oriented to person, place, and time. He appears well-developed and well-nourished. No distress.       Thin appearing  HENT:  Head: Normocephalic and atraumatic.  Eyes: Conjunctivae and EOM are normal. Pupils are equal, round, and reactive to light.    Neck: Normal range of motion. Neck supple.  Cardiovascular: Normal rate, regular rhythm, normal heart sounds and intact distal pulses.  Exam reveals no gallop and no friction rub.   No murmur heard. Pulmonary/Chest: Effort normal and breath sounds normal. No respiratory distress. He has no wheezes. He has no rales. He exhibits no tenderness.  Abdominal: Soft. Bowel sounds are normal. He exhibits no distension and no mass. There is tenderness. There is guarding. There is no rebound.       Diffuse TTP of abdomen with guarding but no rigidity.   Musculoskeletal: Normal range of motion. He exhibits no edema and no tenderness.  Neurological: He is alert and oriented to person, place, and time.  Skin: Skin is warm and dry. No rash noted. He is not diaphoretic. No erythema.  Psychiatric: He has a normal mood and affect.    ED Course  Procedures (including critical care time)  Labs Reviewed  CBC - Abnormal; Notable for the following:    RBC 6.59 (*)    Hemoglobin 18.3 (*)    HCT 53.1 (*)    All other components within normal limits  COMPREHENSIVE METABOLIC PANEL - Abnormal; Notable for the following:    Glucose, Bld 100 (*)    Total Protein 8.4 (*)    Total Bilirubin 2.8 (*)    All other components within normal limits  DIFFERENTIAL  LIPASE, BLOOD  ETHANOL  URINE RAPID DRUG SCREEN (HOSP PERFORMED)   No results found.   No diagnosis found.    MDM  Pending CT  scan for dispo. Dr. Nicanor Alcon to follow for dispo        Jenness Corner, PA 05/15/11 [phone]

## 2011-05-14 NOTE — ED Notes (Signed)
C/o abd pain, chest pain, nausea, and vomiting x 3 weeks.  States he has history of this over the last 9 years and was recently seen in ED for same.

## 2011-05-15 ENCOUNTER — Emergency Department (HOSPITAL_COMMUNITY): Payer: Self-pay

## 2011-05-15 LAB — RAPID URINE DRUG SCREEN, HOSP PERFORMED
Amphetamines: NOT DETECTED
Barbiturates: NOT DETECTED
Benzodiazepines: NOT DETECTED
Cocaine: NOT DETECTED

## 2011-05-15 MED ORDER — IOHEXOL 300 MG/ML  SOLN
100.0000 mL | Freq: Once | INTRAMUSCULAR | Status: AC | PRN
  Administered 2011-05-15: 100 mL via INTRAVENOUS

## 2011-05-15 MED ORDER — IBUPROFEN 600 MG PO TABS: 600.0000 mg | ORAL_TABLET | Freq: Four times a day (QID) | ORAL | Status: AC | PRN

## 2011-05-15 NOTE — ED Notes (Signed)
Pt denies any need at this time, VSS, dc' ed instructions given and explained to pt. Pt stated will call primary provide tomorrow for appointment.

## 2011-05-15 NOTE — ED Provider Notes (Signed)
Medical screening examination/treatment/procedure(s) were conducted as a shared visit with non-physician practitioner(s) and myself.  I personally evaluated the patient during the encounter  Hx chronic abd pain, c/o same today, ongoing for the past month.  Labs unremarkable, CT A/P pending.    Dr. Nicanor Alcon to follow.   Laray Anger, DO 05/15/11 1812

## 2011-05-15 NOTE — ED Notes (Signed)
Pt resting/sleeping on L side, now to CT.

## 2011-07-07 ENCOUNTER — Other Ambulatory Visit: Payer: Self-pay

## 2011-07-07 ENCOUNTER — Encounter (HOSPITAL_COMMUNITY): Payer: Self-pay | Admitting: *Deleted

## 2011-07-07 ENCOUNTER — Emergency Department (HOSPITAL_COMMUNITY)
Admission: EM | Admit: 2011-07-07 | Discharge: 2011-07-07 | Disposition: A | Discharge status: Home / Self Care | Payer: Self-pay | Attending: Emergency Medicine | Admitting: Emergency Medicine

## 2011-07-07 DIAGNOSIS — R109 Unspecified abdominal pain: Secondary | ICD-10-CM | POA: Insufficient documentation

## 2011-07-07 DIAGNOSIS — R10819 Abdominal tenderness, unspecified site: Secondary | ICD-10-CM | POA: Insufficient documentation

## 2011-07-07 LAB — COMPREHENSIVE METABOLIC PANEL
Albumin: 4.9 g/dL (ref 3.5–5.2)
Alkaline Phosphatase: 88 U/L (ref 39–117)
BUN: 16 mg/dL (ref 6–23)
Calcium: 10 mg/dL (ref 8.4–10.5)
Creatinine, Ser: 0.73 mg/dL (ref 0.50–1.35)
GFR calc Af Amer: >90 mL/min (ref >90)
Glucose, Bld: 97 mg/dL (ref 70–99)
Potassium: 3.3 mEq/L — ABNORMAL LOW (ref 3.5–5.1)
Total Protein: 8.3 g/dL (ref 6.0–8.3)

## 2011-07-07 LAB — CBC
HCT: 53.3 % — ABNORMAL HIGH (ref 39.0–52.0)
MCH: 27.6 pg (ref 26.0–34.0)
MCHC: 34.5 g/dL (ref 30.0–36.0)
RDW: 12.7 % (ref 11.5–15.5)

## 2011-07-07 LAB — DIFFERENTIAL
Basophils Absolute: 0.1 10*3/uL (ref 0.0–0.1)
Basophils Relative: 1 % (ref 0–1)
Eosinophils Absolute: 0 10*3/uL (ref 0.0–0.7)
Monocytes Absolute: 0.9 10*3/uL (ref 0.1–1.0)
Monocytes Relative: 12 % (ref 3–12)
Neutro Abs: 4.6 10*3/uL (ref 1.7–7.7)

## 2011-07-07 LAB — LACTIC ACID, PLASMA: Lactic Acid, Venous: 1.2 mmol/L (ref 0.5–2.2)

## 2011-07-07 MED ORDER — DROPERIDOL 2.5 MG/ML IJ SOLN
2.5000 mg | Freq: Once | INTRAMUSCULAR | Status: AC
  Administered 2011-07-07: 2.5 mg via INTRAVENOUS
  Filled 2011-07-07: qty 1

## 2011-07-07 MED ORDER — HYDROMORPHONE HCL PF 1 MG/ML IJ SOLN
1.0000 mg | Freq: Once | INTRAMUSCULAR | Status: AC
  Administered 2011-07-07: 1 mg via INTRAVENOUS
  Filled 2011-07-07: qty 1

## 2011-07-07 MED ORDER — SODIUM CHLORIDE 0.9 % IV BOLUS (SEPSIS)
1000.0000 mL | Freq: Once | INTRAVENOUS | Status: AC
  Administered 2011-07-07: 1000 mL via INTRAVENOUS

## 2011-07-07 MED ORDER — FENTANYL CITRATE 0.05 MG/ML IJ SOLN
100.0000 ug | Freq: Once | INTRAMUSCULAR | Status: AC
  Administered 2011-07-07: 100 ug via INTRAVENOUS
  Filled 2011-07-07: qty 2

## 2011-07-07 NOTE — ED Notes (Signed)
Called x 2. No answer 

## 2011-07-07 NOTE — ED Notes (Signed)
Called x1. No answer.

## 2011-07-07 NOTE — ED Notes (Signed)
Patient left at this time.  No discharge instructions given

## 2011-07-07 NOTE — ED Notes (Signed)
Patient presents with c/o abd pain for 9 years, unable to keep anything down at this time.  Denies diarrhea  Abd pain tender to touch

## 2011-07-07 NOTE — Discharge Instructions (Signed)

## 2011-07-07 NOTE — ED Notes (Signed)
To ed for eval of abd pain and cp. States he has been 'going through this for 9 years'. Sent to 'all kinds of specialists and they don't know what it is'. Skin w/d, resp e/u.

## 2011-07-07 NOTE — ED Provider Notes (Signed)
History     CSN: [id number]  Arrival date & time 07/07/11  1738   First MD Initiated Contact with Patient 07/07/11 2029      Chief Complaint  Patient presents with  . Abdominal Pain    HPI Patient who has Chronic intermittent abdominal pain x > 10 years with previous multi CT A/P, endoscopy/colonoscopy inpt/outpatient w/u [facility], [place] without obvious cause but pssible gastroparesis per patient who was recently seen in ER returns to ER complaining of a 3 day hx of uncontrollable vomiting and ongoing chronic abdominal pain. Patient denies hx of abdominal surgery. Patient denies aggravating or alleviating factors stating pain is constant, severe and unchanging. Patient states he does not have daily pain meds but is followed by a PCP in [facility]. Patient took nothing for pain PTA. Pain is consistent with 9-10 years of chronic pan but states pain is aggravating by intractable vomiting.   Past Medical History  Diagnosis Date  . Abdominal pain     History reviewed. No pertinent past surgical history.  History reviewed. No pertinent family history.  History  Substance Use Topics  . Smoking status: Never Smoker   . Smokeless tobacco: Not on file  . Alcohol Use: No      Review of Systems  All other systems reviewed and are negative.    Allergies  Morphine and related; Tramadol; Vicodin; and Protonix  Home Medications  No current outpatient prescriptions on file.  BP 129/78  Pulse 88  Temp(Src) 98.4 F (36.9 C) (Oral)  Resp 16  SpO2 97%  Physical Exam  Nursing note and vitals reviewed. Constitutional: He is oriented to person, place, and time. He appears well-developed and well-nourished. No distress.       Thin appearing  HENT:  Head: Normocephalic and atraumatic.  Eyes: Conjunctivae and EOM are normal. Pupils are equal, round, and reactive to light.  Neck: Normal range of motion. Neck supple.  Cardiovascular: Normal rate, regular rhythm, normal heart  sounds and intact distal pulses.  Exam reveals no gallop and no friction rub.   No murmur heard. Pulmonary/Chest: Effort normal and breath sounds normal. No respiratory distress. He has no wheezes. He has no rales. He exhibits no tenderness.  Abdominal: Soft. Bowel sounds are normal. He exhibits no distension and no mass. There is tenderness. There is guarding. There is no rebound.       Diffuse TTP of abdomen with guarding but no rigidity.   Musculoskeletal: Normal range of motion. He exhibits no edema and no tenderness.  Neurological: He is alert and oriented to person, place, and time.  Skin: Skin is warm and dry. No rash noted. He is not diaphoretic. No erythema.  Psychiatric: He has a normal mood and affect.    ED Course  Procedures (including critical care time) Scheduled Meds:   . droperidol  2.5 mg Intravenous Once  . fentaNYL  100 mcg Intravenous Once  .  HYDROmorphone (DILAUDID) injection  1 mg Intravenous Once  . sodium chloride  1,000 mL Intravenous Once   Continuous Infusions:  PRN Meds:.  Labs Reviewed  CBC - Abnormal; Notable for the following:    RBC 6.66 (*)    Hemoglobin 18.4 (*)    HCT 53.3 (*)    All other components within normal limits  COMPREHENSIVE METABOLIC PANEL - Abnormal; Notable for the following:    Potassium 3.3 (*)    Chloride 93 (*)    Total Bilirubin 2.2 (*)  All other components within normal limits  DIFFERENTIAL  LIPASE, BLOOD  LACTIC ACID, PLASMA   No results found.   1. Abdominal pain       MDM  After treatment in the ED the patient feels back to baseline and wants to go home.         Nelia Shi, MD 07/07/11 [phone]

## 2011-07-07 NOTE — ED Notes (Signed)
Patient stated that the pain med make him feel "funny", became sweaty and states that he felt like he wasn't in his body

## 2012-02-11 ENCOUNTER — Emergency Department (HOSPITAL_COMMUNITY): Payer: Self-pay

## 2012-02-11 ENCOUNTER — Emergency Department (HOSPITAL_COMMUNITY)
Admission: EM | Admit: 2012-02-11 | Discharge: 2012-02-11 | Disposition: A | Discharge status: Home / Self Care | Payer: Self-pay | Attending: Emergency Medicine | Admitting: Emergency Medicine

## 2012-02-11 DIAGNOSIS — R109 Unspecified abdominal pain: Secondary | ICD-10-CM

## 2012-02-11 DIAGNOSIS — R5381 Other malaise: Secondary | ICD-10-CM | POA: Insufficient documentation

## 2012-02-11 DIAGNOSIS — R1084 Generalized abdominal pain: Secondary | ICD-10-CM | POA: Insufficient documentation

## 2012-02-11 DIAGNOSIS — R112 Nausea with vomiting, unspecified: Secondary | ICD-10-CM | POA: Insufficient documentation

## 2012-02-11 DIAGNOSIS — J029 Acute pharyngitis, unspecified: Secondary | ICD-10-CM | POA: Insufficient documentation

## 2012-02-11 DIAGNOSIS — R630 Anorexia: Secondary | ICD-10-CM | POA: Insufficient documentation

## 2012-02-11 LAB — COMPREHENSIVE METABOLIC PANEL
AST: 19 U/L (ref 0–37)
Albumin: 5.1 g/dL (ref 3.5–5.2)
Alkaline Phosphatase: 101 U/L (ref 39–117)
BUN: 19 mg/dL (ref 6–23)
Chloride: 95 mEq/L — ABNORMAL LOW (ref 96–112)
Creatinine, Ser: 0.77 mg/dL (ref 0.50–1.35)
Potassium: 3.9 mEq/L (ref 3.5–5.1)
Total Protein: 9.8 g/dL — ABNORMAL HIGH (ref 6.0–8.3)

## 2012-02-11 LAB — CBC WITH DIFFERENTIAL/PLATELET
Basophils Relative: 0 % (ref 0–1)
HCT: 51.4 % (ref 39.0–52.0)
Hemoglobin: 17.6 g/dL — ABNORMAL HIGH (ref 13.0–17.0)
Lymphocytes Relative: 12 % (ref 12–46)
Lymphs Abs: 1.2 10*3/uL (ref 0.7–4.0)
MCHC: 34.2 g/dL (ref 30.0–36.0)
Monocytes Absolute: 0.5 10*3/uL (ref 0.1–1.0)
Monocytes Relative: 5 % (ref 3–12)
Neutro Abs: 8 10*3/uL — ABNORMAL HIGH (ref 1.7–7.7)
Neutrophils Relative %: 82 % — ABNORMAL HIGH (ref 43–77)
RBC: 6.58 MIL/uL — ABNORMAL HIGH (ref 4.22–5.81)
WBC: 9.7 10*3/uL (ref 4.0–10.5)

## 2012-02-11 MED ORDER — SODIUM CHLORIDE 0.9 % IV SOLN
1000.0000 mL | Freq: Once | INTRAVENOUS | Status: AC
  Administered 2012-02-11: 1000 mL via INTRAVENOUS

## 2012-02-11 MED ORDER — DIAZEPAM 5 MG PO TABS: 5.0000 mg | ORAL_TABLET | Freq: Four times a day (QID) | ORAL | Status: DC | PRN

## 2012-02-11 MED ORDER — GI COCKTAIL ~~LOC~~
30.0000 mL | Freq: Once | ORAL | Status: AC
  Administered 2012-02-11: 30 mL via ORAL
  Filled 2012-02-11: qty 30

## 2012-02-11 MED ORDER — HYDROMORPHONE HCL PF 1 MG/ML IJ SOLN
1.0000 mg | Freq: Once | INTRAMUSCULAR | Status: AC
  Administered 2012-02-11: 1 mg via INTRAVENOUS
  Filled 2012-02-11: qty 1

## 2012-02-11 MED ORDER — OXYCODONE-ACETAMINOPHEN 5-325 MG PO TABS: 2.0000 | ORAL_TABLET | ORAL | Status: DC | PRN

## 2012-02-11 MED ORDER — ONDANSETRON 4 MG PO TBDP
4.0000 mg | ORAL_TABLET | Freq: Once | ORAL | Status: DC
  Filled 2012-02-11: qty 1

## 2012-02-11 MED ORDER — ONDANSETRON HCL 4 MG/2ML IJ SOLN
4.0000 mg | Freq: Once | INTRAMUSCULAR | Status: AC
  Administered 2012-02-11: 4 mg via INTRAVENOUS

## 2012-02-11 MED ORDER — HYDROCODONE-ACETAMINOPHEN 5-325 MG PO TABS: 1.0000 | ORAL_TABLET | Freq: Four times a day (QID) | ORAL | Status: DC | PRN

## 2012-02-11 MED ORDER — ONDANSETRON HCL 4 MG/2ML IJ SOLN
INTRAMUSCULAR | Status: AC
  Administered 2012-02-11: 4 mg via INTRAVENOUS
  Filled 2012-02-11: qty 2

## 2012-02-11 MED ORDER — ONDANSETRON 8 MG PO TBDP: 8.0000 mg | ORAL_TABLET | Freq: Once | ORAL | Status: DC

## 2012-02-11 MED ORDER — ONDANSETRON 8 MG PO TBDP
8.0000 mg | ORAL_TABLET | Freq: Once | ORAL | Status: AC
  Administered 2012-02-11: 8 mg via ORAL
  Filled 2012-02-11: qty 1

## 2012-02-11 MED ORDER — LORAZEPAM 2 MG/ML IJ SOLN
1.0000 mg | Freq: Once | INTRAMUSCULAR | Status: AC
Start: 2012-02-11 — End: 2012-02-11
  Administered 2012-02-11: 1 mg via INTRAVENOUS
  Filled 2012-02-11: qty 1

## 2012-02-11 MED ORDER — LORAZEPAM 2 MG/ML IJ SOLN
1.0000 mg | Freq: Once | INTRAMUSCULAR | Status: AC
  Administered 2012-02-11: 1 mg via INTRAVENOUS
  Filled 2012-02-11: qty 1

## 2012-02-11 NOTE — ED Notes (Signed)
Pt states "he has been going through the same stomach issues for over 13yrs and no one can ever find anything! States his GI doctor is in [place]."

## 2012-02-11 NOTE — ED Notes (Signed)
RUE:[id number]<JW> Expected date:02/11/12<BR> Expected time:12:10 PM<BR> Means of arrival:Ambulance<BR> Comments:<BR> N/V history of GERD

## 2012-02-11 NOTE — ED Notes (Signed)
Pt states "he has had abd issues in the past. He has had a upper endo/colonoscopy which showed nothing". Pt states "he started feeling bad yesterday morning and the n/v started around 2pm 11/9. "He has tried to stay hydrated however anything that he takes in comes right back out. He has a prescription for phenergan which he has been taking with no relief". Per [facility], pt was in his bed on arrival with a small bathroom size trashcan at bedside. [facility] [place] was half full of brown/greenish colored emesis. VSS in route. Pt is alert and oriented. States he is having generalized abdominal pain.

## 2012-02-11 NOTE — ED Provider Notes (Signed)
History     CSN: [id number]  Arrival date & time 02/11/12  1212   First MD Initiated Contact with Patient 02/11/12 1323      Chief Complaint  Patient presents with  . Nausea  . Emesis  . Abdominal Pain    (Consider location/radiation/quality/duration/timing/severity/associated sxs/prior treatment) HPI Comments: Lawrence Lutz 26 y.o. male   The chief complaint is: Patient presents with:   Nausea   Emesis   Abdominal Pain    Patient with past medical history significant for chronic intermittent nausea and vomiting presents to the emergency department with chief complaint of abdominal pain nausea and vomiting.  Patient states that his episode began yesterday.  He spent only a yesterday vomiting profusely.  He denies any diarrhea.  He denies any blood in his vomit.  Patient has history of this chronically.  He is followed at [facility].  He has had multiple workups including CT scan, and endoscopy.  Patient states he feels that this frequently and generally stays at home unless his vomiting and nausea are uncontrolled.  He does take Phenergan.  He is on able to control his symptoms with this medication. Denies any sick contacts. Or travel hisotry. Sxs same as usual.Per EMS Trashcan was full of brown/green vomit.    Denies fevers, chills, myalgias, arthralgias. Denies DOE, SOB, chest tightness or pressure, radiation to left arm, jaw or back, or diaphoresis. Denies dysuria, flank pain, suprapubic pain, frequency, urgency, or hematuria.    Patient is a 26 y.o. male presenting with vomiting and abdominal pain. The history is provided by the patient and medical records. No language interpreter was used.  Emesis  Associated symptoms include abdominal pain and chills. Pertinent negatives include no diarrhea and no fever.  Abdominal Pain The primary symptoms of the illness include abdominal pain, fatigue, nausea and vomiting. The primary symptoms of the illness do not include fever or diarrhea.   Additional symptoms associated with the illness include chills. Symptoms associated with the illness do not include diaphoresis or constipation.    Past Medical History  Diagnosis Date  . Abdominal pain     No past surgical history on file.  No family history on file.  History  Substance Use Topics  . Smoking status: Never Smoker   . Smokeless tobacco: Not on file  . Alcohol Use: No      Review of Systems  Constitutional: Positive for chills, activity change, appetite change and fatigue. Negative for fever and diaphoresis.  HENT: Positive for sore throat.   Eyes: Negative.   Respiratory: Negative.   Cardiovascular: Negative.   Gastrointestinal: Positive for nausea, vomiting and abdominal pain. Negative for diarrhea, constipation and blood in stool.  Genitourinary: Negative.   Musculoskeletal: Negative.   Skin: Negative.   Neurological: Negative.   All other systems reviewed and are negative.    Allergies  Morphine and related; Tramadol; Vicodin; and Pantoprazole sodium  Home Medications  No current outpatient prescriptions on file.  BP 116/61  Pulse 88  Temp 97.9 F (36.6 C) (Oral)  Resp 16  SpO2 99%  Physical Exam  Nursing note and vitals reviewed. Constitutional: He is oriented to person, place, and time.       Thin, ill appearing male.  HENT:  Head: Normocephalic and atraumatic.  Eyes: Conjunctivae normal and EOM are normal. Pupils are equal, round, and reactive to light.       Eyes are sunken  Neck: Normal range of motion. Neck supple.  Cardiovascular: Regular rhythm, normal  heart sounds and intact distal pulses.  Exam reveals no gallop and no friction rub.   No murmur heard.      Bradycardic.  Pulmonary/Chest: Effort normal and breath sounds normal. Stridor present. No respiratory distress.  Abdominal: Soft. He exhibits no distension and no mass. There is tenderness. There is no guarding.       Diffusely ttp. No distention , rebound, guarding.    Neurological: He is alert and oriented to person, place, and time. No cranial nerve deficit.  Skin:       Poor turgor     ED Course  Procedures (including critical care time)  Labs Reviewed  CBC WITH DIFFERENTIAL - Abnormal; Notable for the following:    RBC 6.58 (*)     Hemoglobin 17.6 (*)     Neutrophils Relative 82 (*)     Neutro Abs 8.0 (*)     All other components within normal limits  COMPREHENSIVE METABOLIC PANEL - Abnormal; Notable for the following:    Chloride 95 (*)     Glucose, Bld 144 (*)     Calcium 10.7 (*)     Total Protein 9.8 (*)     All other components within normal limits  LIPASE, BLOOD  URINALYSIS, ROUTINE W REFLEX MICROSCOPIC  BASIC METABOLIC PANEL   US Abdomen Complete  02/11/2012  *RADIOLOGY REPORT*  Clinical Data:  Emesis, nausea.  Abdominal pain.  COMPLETE ABDOMINAL ULTRASOUND  Comparison:  CT of the abdomen and pelvis 05/15/2011  Findings:  Gallbladder:  Gallbladder has a normal appearance.  Gallbladder wall is 2 mm the, within normal limits.  No stones or pericholecystic fluid.  No sonographic Murphy's sign.  Common bile duct:  4 mm, within normal limits.  Liver:  No focal lesion identified.  Within normal limits in parenchymal echogenicity.  IVC:  No pancreatic mass is identified.  Pancreatic duct is mildly prominent at 3 mm.  Pancreas:  No focal abnormality seen.  Spleen:  Normal in appearance, 7.8 cm in length.  Right Kidney:  Normal appearance, 10.6 cm in length.  Left Kidney:  10.7 cm in length.  No mass.  Abdominal aorta:  1.8 cm in maximum diameter.  IMPRESSION:  1.  No evidence for acute cholecystitis. 2.  Pancreatic duct is mildly prominent.  Otherwise, no pancreatic abnormality identified.   Original Report Authenticated By: Norva Pavlov, M.D.      No diagnosis found.    MDM  Patient is without nausea after Fluid resus, zofran and pain.  Patient is requesting a gi cocktail. I will give patient PO challenge, test orthostatics and if  stable will have patient f/u with PCP.    6:22 PM BP 116/61  Pulse 88  Temp 97.9 F (36.6 C) (Oral)  Resp 16  SpO2 99% Patient is tolerating PO fluids. I will d/c home.  Paitent may f/u with his PCP   Arthor Captain, PA-C 02/11/12 1823

## 2012-02-12 NOTE — ED Provider Notes (Signed)
Medical screening examination/treatment/procedure(s) were performed by non-physician practitioner and as supervising physician I was immediately available for consultation/collaboration.  Doug Sou, MD 02/12/12 [phone]

## 2012-02-13 ENCOUNTER — Emergency Department (HOSPITAL_COMMUNITY)
Admission: EM | Admit: 2012-02-13 | Discharge: 2012-02-14 | Disposition: A | Discharge status: Home / Self Care | Payer: Self-pay | Attending: Emergency Medicine | Admitting: Emergency Medicine

## 2012-02-13 ENCOUNTER — Encounter (HOSPITAL_COMMUNITY): Payer: Self-pay

## 2012-02-13 DIAGNOSIS — R109 Unspecified abdominal pain: Secondary | ICD-10-CM | POA: Insufficient documentation

## 2012-02-13 DIAGNOSIS — R112 Nausea with vomiting, unspecified: Secondary | ICD-10-CM | POA: Insufficient documentation

## 2012-02-13 LAB — CBC
HCT: 44.2 % (ref 39.0–52.0)
MCH: 26.8 pg (ref 26.0–34.0)
MCV: 78.9 fL (ref 78.0–100.0)
Platelets: 291 10*3/uL (ref 150–400)
RBC: 5.6 MIL/uL (ref 4.22–5.81)
WBC: 7.4 10*3/uL (ref 4.0–10.5)

## 2012-02-13 LAB — COMPREHENSIVE METABOLIC PANEL
ALT: 9 U/L (ref 0–53)
AST: 13 U/L (ref 0–37)
Albumin: 4 g/dL (ref 3.5–5.2)
Alkaline Phosphatase: 78 U/L (ref 39–117)
Glucose, Bld: 88 mg/dL (ref 70–99)
Potassium: 3.5 mEq/L (ref 3.5–5.1)
Sodium: 137 mEq/L (ref 135–145)
Total Protein: 7.4 g/dL (ref 6.0–8.3)

## 2012-02-13 MED ORDER — HYDROMORPHONE HCL PF 1 MG/ML IJ SOLN
0.5000 mg | Freq: Once | INTRAMUSCULAR | Status: AC
  Administered 2012-02-13: 0.5 mg via INTRAVENOUS
  Filled 2012-02-13: qty 1

## 2012-02-13 MED ORDER — HYDROMORPHONE HCL PF 1 MG/ML IJ SOLN
1.0000 mg | Freq: Once | INTRAMUSCULAR | Status: AC
  Administered 2012-02-14: 1 mg via INTRAVENOUS
  Filled 2012-02-13: qty 1

## 2012-02-13 MED ORDER — LORAZEPAM 2 MG/ML IJ SOLN
2.0000 mg | Freq: Once | INTRAMUSCULAR | Status: AC
  Administered 2012-02-13: 2 mg via INTRAVENOUS
  Filled 2012-02-13: qty 1

## 2012-02-13 MED ORDER — SODIUM CHLORIDE 0.9 % IV SOLN
Freq: Once | INTRAVENOUS | Status: AC
  Administered 2012-02-13: 1000 mL via INTRAVENOUS

## 2012-02-13 MED ORDER — GI COCKTAIL ~~LOC~~
30.0000 mL | Freq: Once | ORAL | Status: AC
  Administered 2012-02-13: 30 mL via ORAL
  Filled 2012-02-13: qty 30

## 2012-02-13 NOTE — ED Notes (Signed)
Pt presents with NAD- Per [facility]- pt has a HX of the same N/V with lower abd pain.  Seen and treated for the same this past Sunday returned here for no change

## 2012-02-13 NOTE — ED Provider Notes (Signed)
History     CSN: [id number]  Arrival date & time 02/13/12  8413   First MD Initiated Contact with Patient 02/13/12 2001      Chief Complaint  Patient presents with  . Nausea  . Emesis  . Abdominal Pain    (Consider location/radiation/quality/duration/timing/severity/associated sxs/prior treatment) HPI Comments: Patient with chronic intermittent abdominal pain with nausea/vomiting but no diarrhea.  Has had these symptoms for the past 19 years has had numerous test at several facilities with no definitive diagnosis.  Was seen here 2 days ago had 1 pain free day but immediately after eating tonight again developed the symptoms   Patient is a 26 y.o. male presenting with vomiting and abdominal pain. The history is provided by the patient.  Emesis  This is a recurrent problem. The problem has not changed since onset.The emesis has an appearance of bilious material. There has been no fever. Associated symptoms include abdominal pain. Pertinent negatives include no chills, no cough, no diarrhea, no fever and no myalgias.  Abdominal Pain The primary symptoms of the illness include abdominal pain, nausea and vomiting. The primary symptoms of the illness do not include fever, diarrhea or dysuria.  Symptoms associated with the illness do not include chills, constipation, frequency or back pain.    Past Medical History  Diagnosis Date  . Abdominal pain     History reviewed. No pertinent past surgical history.  No family history on file.  History  Substance Use Topics  . Smoking status: Never Smoker   . Smokeless tobacco: Not on file  . Alcohol Use: No      Review of Systems  Constitutional: Negative for fever and chills.  HENT: Negative.   Eyes: Negative.   Respiratory: Negative for cough.   Cardiovascular: Negative for chest pain.  Gastrointestinal: Positive for nausea, vomiting and abdominal pain. Negative for diarrhea, constipation and blood in stool.  Genitourinary:  Negative for dysuria, frequency and flank pain.  Musculoskeletal: Negative for myalgias and back pain.  Skin: Negative for rash and wound.  Neurological: Negative.   Psychiatric/Behavioral: Negative.     Allergies  Morphine and related; Tramadol; Vicodin; and Pantoprazole sodium  Home Medications   Current Outpatient Rx  Name  Route  Sig  Dispense  Refill  . DIAZEPAM 5 MG PO TABS   Oral   Take 1 tablet (5 mg total) by mouth every 6 (six) hours as needed for anxiety.   10 tablet   0   . OXYCODONE-ACETAMINOPHEN 5-325 MG PO TABS   Oral   Take 2 tablets by mouth every 4 (four) hours as needed for pain.   10 tablet   0     BP 138/68  Pulse 79  Temp 99.1 F (37.3 C) (Oral)  Resp 20  SpO2 99%  Physical Exam  Constitutional: He is oriented to person, place, and time. He appears well-developed and well-nourished.  HENT:  Head: Normocephalic.  Eyes: Pupils are equal, round, and reactive to light.  Neck: Normal range of motion.  Cardiovascular: Normal rate.   Pulmonary/Chest: Effort normal.  Abdominal: Soft. Bowel sounds are normal. He exhibits no distension and no mass. There is no tenderness. There is no rebound.  Musculoskeletal: Normal range of motion.  Neurological: He is alert and oriented to person, place, and time.  Skin: Skin is warm. No rash noted.       Poor skin turgor    ED Course  Procedures (including critical care time)   Labs Reviewed  CBC  COMPREHENSIVE METABOLIC PANEL  LIPASE, BLOOD   No results found.   No diagnosis found.    MDM  Will review labs although don't expect anything abnormal, will treat with fluids, Dilaudid and antiemetic   Patient's pain improved nausea resolved        Arman Filter, NP 02/14/12 0150

## 2012-02-13 NOTE — ED Notes (Signed)
JYN:[id number]<OZ> Expected date:<BR> Expected time:<BR> Means of arrival:<BR> Comments:<BR> EMS abd pain--seen Sun for same

## 2012-02-14 MED ORDER — LORAZEPAM 1 MG PO TABS: 1.0000 mg | ORAL_TABLET | Freq: Four times a day (QID) | ORAL | Status: DC | PRN

## 2012-02-14 MED ORDER — OXYCODONE-ACETAMINOPHEN 5-325 MG PO TABS
1.0000 | ORAL_TABLET | Freq: Four times a day (QID) | ORAL | Status: DC | PRN
Start: 2012-02-14 — End: 2013-08-03

## 2012-02-14 MED ORDER — HYDROMORPHONE HCL PF 1 MG/ML IJ SOLN
1.0000 mg | Freq: Once | INTRAMUSCULAR | Status: AC
  Administered 2012-02-14: 1 mg via INTRAVENOUS
  Filled 2012-02-14: qty 1

## 2012-02-14 NOTE — ED Provider Notes (Signed)
Medical screening examination/treatment/procedure(s) were performed by non-physician practitioner and as supervising physician I was immediately available for consultation/collaboration.  Raeford Razor, MD 02/14/12 [phone]

## 2013-08-03 ENCOUNTER — Emergency Department (HOSPITAL_COMMUNITY)
Admission: EM | Admit: 2013-08-03 | Discharge: 2013-08-03 | Disposition: A | Discharge status: Home / Self Care | Payer: Self-pay | Attending: Emergency Medicine | Admitting: Emergency Medicine

## 2013-08-03 ENCOUNTER — Emergency Department (HOSPITAL_COMMUNITY): Payer: Self-pay

## 2013-08-03 DIAGNOSIS — F1220 Cannabis dependence, uncomplicated: Secondary | ICD-10-CM | POA: Insufficient documentation

## 2013-08-03 DIAGNOSIS — R1115 Cyclical vomiting syndrome unrelated to migraine: Secondary | ICD-10-CM | POA: Insufficient documentation

## 2013-08-03 LAB — COMPREHENSIVE METABOLIC PANEL
ALT: 13 U/L (ref 0–53)
AST: 13 U/L (ref 0–37)
Albumin: 4.6 g/dL (ref 3.5–5.2)
Alkaline Phosphatase: 94 U/L (ref 39–117)
BUN: 12 mg/dL (ref 6–23)
CALCIUM: 10 mg/dL (ref 8.4–10.5)
CO2: 26 meq/L (ref 19–32)
Chloride: 96 mEq/L (ref 96–112)
Creatinine, Ser: 0.78 mg/dL (ref 0.50–1.35)
GFR calc Af Amer: >90 mL/min (ref >90)
Glucose, Bld: 101 mg/dL — ABNORMAL HIGH (ref 70–99)
POTASSIUM: 3.7 meq/L (ref 3.7–5.3)
SODIUM: 138 meq/L (ref 137–147)
Total Bilirubin: 1.1 mg/dL (ref 0.3–1.2)
Total Protein: 8.8 g/dL — ABNORMAL HIGH (ref 6.0–8.3)

## 2013-08-03 LAB — URINE MICROSCOPIC-ADD ON

## 2013-08-03 LAB — URINALYSIS, ROUTINE W REFLEX MICROSCOPIC
Glucose, UA: NEGATIVE mg/dL
HGB URINE DIPSTICK: NEGATIVE
Leukocytes, UA: NEGATIVE
Nitrite: NEGATIVE
PH: 6 (ref 5.0–8.0)
Protein, ur: 30 mg/dL — AB
Specific Gravity, Urine: 1.037 — ABNORMAL HIGH (ref 1.005–1.030)
Urobilinogen, UA: 0.2 mg/dL (ref 0.0–1.0)

## 2013-08-03 LAB — CBC WITH DIFFERENTIAL/PLATELET
BASOS ABS: 0.1 10*3/uL (ref 0.0–0.1)
Basophils Relative: 1 % (ref 0–1)
EOS PCT: 1 % (ref 0–5)
Eosinophils Absolute: 0.1 10*3/uL (ref 0.0–0.7)
HCT: 52.3 % — ABNORMAL HIGH (ref 39.0–52.0)
Hemoglobin: 17.7 g/dL — ABNORMAL HIGH (ref 13.0–17.0)
LYMPHS PCT: 19 % (ref 12–46)
Lymphs Abs: 1.9 10*3/uL (ref 0.7–4.0)
MCH: 27.1 pg (ref 26.0–34.0)
MCHC: 33.8 g/dL (ref 30.0–36.0)
MCV: 80.2 fL (ref 78.0–100.0)
Monocytes Absolute: 1.1 10*3/uL — ABNORMAL HIGH (ref 0.1–1.0)
Monocytes Relative: 11 % (ref 3–12)
NEUTROS ABS: 6.9 10*3/uL (ref 1.7–7.7)
NEUTROS PCT: 68 % (ref 43–77)
PLATELETS: 235 10*3/uL (ref 150–400)
RBC: 6.52 MIL/uL — ABNORMAL HIGH (ref 4.22–5.81)
RDW: 13 % (ref 11.5–15.5)
WBC: 10.1 10*3/uL (ref 4.0–10.5)

## 2013-08-03 LAB — LIPASE, BLOOD: Lipase: 20 U/L (ref 11–59)

## 2013-08-03 LAB — I-STAT TROPONIN, ED: Troponin i, poc: 0 ng/mL (ref 0.00–0.08)

## 2013-08-03 MED ORDER — LORAZEPAM 2 MG/ML IJ SOLN
1.0000 mg | Freq: Once | INTRAMUSCULAR | Status: AC
  Administered 2013-08-03: 1 mg via INTRAVENOUS
  Filled 2013-08-03: qty 1

## 2013-08-03 MED ORDER — HYDROMORPHONE HCL PF 1 MG/ML IJ SOLN
1.0000 mg | Freq: Once | INTRAMUSCULAR | Status: AC
  Administered 2013-08-03: 1 mg via INTRAVENOUS
  Filled 2013-08-03: qty 1

## 2013-08-03 MED ORDER — ONDANSETRON HCL 4 MG/2ML IJ SOLN
4.0000 mg | Freq: Once | INTRAMUSCULAR | Status: AC
  Administered 2013-08-03: 4 mg via INTRAVENOUS
  Filled 2013-08-03: qty 2

## 2013-08-03 MED ORDER — GI COCKTAIL ~~LOC~~
30.0000 mL | Freq: Once | ORAL | Status: AC
  Administered 2013-08-03: 30 mL via ORAL
  Filled 2013-08-03: qty 30

## 2013-08-03 MED ORDER — KETOROLAC TROMETHAMINE 30 MG/ML IJ SOLN
30.0000 mg | Freq: Once | INTRAMUSCULAR | Status: AC
  Administered 2013-08-03: 30 mg via INTRAVENOUS
  Filled 2013-08-03: qty 1

## 2013-08-03 MED ORDER — SODIUM CHLORIDE 0.9 % IV BOLUS (SEPSIS)
1000.0000 mL | Freq: Once | INTRAVENOUS | Status: AC
  Administered 2013-08-03: 1000 mL via INTRAVENOUS

## 2013-08-03 MED ORDER — METOCLOPRAMIDE HCL 10 MG PO TABS: 10.0000 mg | ORAL_TABLET | Freq: Four times a day (QID) | ORAL | Status: DC

## 2013-08-03 MED ORDER — PROMETHAZINE HCL 25 MG RE SUPP: 25.0000 mg | Freq: Four times a day (QID) | RECTAL | Status: DC | PRN

## 2013-08-03 MED ORDER — METOCLOPRAMIDE HCL 5 MG/ML IJ SOLN
10.0000 mg | Freq: Once | INTRAMUSCULAR | Status: AC
  Administered 2013-08-03: 10 mg via INTRAVENOUS
  Filled 2013-08-03: qty 2

## 2013-08-03 NOTE — Discharge Instructions (Signed)
Take reglan and phenergan suppositories for nausea and vomiting. Drink plenty of fluids. Stop smoking weed. Follow up with primary care doctor.    Cyclic Vomiting Syndrome Cyclic vomiting syndrome is a benign condition in which patients experience bouts or cycles of severe nausea and vomiting that last for hours or even days. The bouts of nausea and vomiting alternate with longer periods of no symptoms and generally good health. Cyclic vomiting syndrome occurs mostly in children, but can affect adults. CAUSES  [facility] has no known cause. Each episode is typically similar to the previous ones. The episodes tend to:   Start at about the same time of day.  Last the same length of time.  Present the same symptoms at the same level of intensity. Cyclic vomiting syndrome can begin at any age in children and adults. Cyclic vomiting syndrome usually starts between the ages of 3 and 7 years. In adults, episodes tend to occur less often than they do in children, but they last longer. Furthermore, the events or situations that trigger episodes in adults cannot always be pinpointed as easily as they can in children. There are 4 phases of cyclic vomiting syndrome: 1. Prodrome. The prodrome phase signals that an episode of nausea and vomiting is about to begin. This phase can last from just a few minutes to several hours. This phase is often marked by belly (abdominal) pain. Sometimes taking medicine early in the prodrome phase can stop an episode in progress. However, sometimes there is no warning. A person may simply wake up in the middle of the night or early morning and begin vomiting. 2. Episode. The episode phase consists of:  Severe vomiting.  Nausea.  Gagging (retching). 3. Recovery. The recovery phase begins when the nausea and vomiting stop. Healthy color, appetite, and energy return. 4. Symptom-free interval. The symptom-free interval phase is the period between episodes when no symptoms are  present. TRIGGERS Episodes can be triggered by an infection or event. Examples of triggers include:  Infections.  Colds, allergies, sinus problems, and the flu.  Eating certain foods such as chocolate or cheese.  Foods with monosodium glutamate (MSG) or preservatives.  Fast foods.  Pre-packaged foods.  Foods with low nutritional value (junk foods).  Overeating.  Eating just before going to bed.  Hot weather.  Dehydration.  Not enough sleep or poor sleep quality.  Physical exhaustion.  Menstruation.  Motion sickness.  Emotional stress (school or home difficulties).  Excitement or stress. SYMPTOMS  The main symptoms of cyclic vomiting syndrome are:  Severe vomiting.  Nausea.  Gagging (retching). Episodes usually begin at night or the first thing in the morning. Episodes may include vomiting or retching up to 5 or 6 times an hour during the worst of the episode. Episodes usually last anywhere from 1 to 4 days. Episodes can last for up to 10 days. Other symptoms include:  Paleness.  Exhaustion.  Listlessness.  Abdominal pain.  Loose stools or diarrhea. Sometimes the nausea and vomiting are so severe that a person appears to be almost unconscious. Sensitivity to light, headache, fever, dizziness, may also accompany an episode. In addition, the vomiting may cause drooling and excessive thirst. Drinking water usually leads to more vomiting, though the water can dilute the acid in the vomit, making the episode a little less painful. Continuous vomiting can lead to dehydration, which means that the body has lost excessive water and salts. DIAGNOSIS  Cyclic vomiting syndrome is hard to diagnose because there are no clear  tests to identify it. A caregiver must diagnose cyclic vomiting syndrome by looking at symptoms and medical history. A caregiver must exclude more common diseases or disorders that can also cause nausea and vomiting. Also, diagnosis takes time because  caregivers need to identify a pattern or cycle to the vomiting. TREATMENT  Cyclic vomiting syndrome cannot be cured. Treatment varies, but people with cyclic vomiting syndrome should get plenty of rest and sleep and take medications that prevent, stop, or lessen the vomiting episodes and other symptoms. People whose episodes are frequent and long-lasting may be treated during the symptom-free intervals in an effort to prevent or ease future episodes. The symptom-free phase is a good time to eliminate anything known to trigger an episode. For example, if episodes are brought on by stress or excitement, this period is the time to find ways to reduce stress and stay calm. If sinus problems or allergies cause episodes, those conditions should be treated. The triggers listed above should be avoided or prevented. Because of the similarities between migraine and cyclic vomiting syndrome, caregivers treat some people with severe cyclic vomiting syndrome with drugs that are also used for migraine headaches. The drugs are designed to:  Prevent episodes.  Reduce their frequency.  Lessen their severity. HOME CARE INSTRUCTIONS Once a vomiting episode begins, treatment is supportive. It helps to stay in bed and sleep in a dark, quiet room. Severe nausea and vomiting may require hospitalization and intravenous (IV) fluids to prevent dehydration. Relaxing medications (sedatives) may help if the nausea continues. Sometimes, during the prodrome phase, it is possible to stop an episode from happening altogether. Only take over-the-counter or prescription medicines for pain, discomfort or fever as directed by your caregiver. Do not give aspirin to children. During the recovery phase, drinking water and replacing lost electrolytes (salts in the blood) are very important. Electrolytes are salts that the body needs to function well and stay healthy. Symptoms during the recovery phase can vary. Some people find that their  appetites return to normal immediately, while others need to begin by drinking clear liquids and then move slowly to solid food. RELATED COMPLICATIONS The severe vomiting that defines cyclic vomiting syndrome is a risk factor for several complications:  Dehydration Vomiting causes the body to lose water quickly.  Electrolyte imbalance Vomiting also causes the body to lose the important salts it needs to keep working properly.  Peptic esophagitis The tube that connects the mouth to the stomach (esophagus) becomes injured from the stomach acid that comes up with the vomit.  Hematemesis The esophagus becomes irritated and bleeds, so blood mixes with the vomit.  Mallory-Weiss tear The lower end of the esophagus may tear open or the stomach may bruise from vomiting or retching.  Tooth decay The acid in the vomit can hurt the teeth by corroding the tooth enamel. SEEK MEDICAL CARE IF: You have questions or problems. Document Released: 05/29/2001 Document Revised: 06/12/2011 Document Reviewed: 06/27/2010 [facility] Patient Information 2014 [place], [place].  Marijuana Abuse and Chemical Dependency WHEN IS DRUG USE A PROBLEM? Problems related to drug use usually begin with abuse of the substance and lead to dependency.  Abuse is repeated use of a drug with recurrent and significant negative consequences. Abuse happens anytime drug use is interfering with normal living activities including:   Failure to fulfill major obligations at work, school or home (poor work International aid/development worker, missing work or school and/or neglecting children and home).  Engaging in activities that are physically dangerous (driving a car  or doing recreational activities such as swimming or rock climbing) while under the effects of the drug.  Recurrent drug-related legal problems (arrests for disorderly conduct or assault and battery).  Recurrent social or interpersonal problems caused or increased by the effects of the drug  (arguments with family or friends, or physical fights). Dependency has two parts.   You first develop an emotional/psychological dependence. Psychological dependence develops when your mind tells you that the drug is needed. You come to believe it helps you cope with life.  This is usually followed by physical dependence which has developed when continuing increases of drugs are required to get the same feeling or "high." This may result in:  Withdrawal symptoms such as shakes or tremors.  The substance being over a longer period of time than intended.  An ongoing desire, or unsuccessful effort to, cut down or control the use.  Greater amounts of time spent getting the drug, using the drug or recovering from the effects of the drug.  Important social, work or interests and activities are given up or reduced because or drug use.  Substance is used despite knowledge of ongoing physical (ulcers) or psychological (depression) problems. SIGNS OF CHEMICAL DEPENDENCY:  Friends or family say there is a problem.  Fighting when using drugs.  Having blackouts (not remembering what you do while using).  Feel sick from using drugs but continue using.  Lie about use or amounts of drugs used.  Need drugs to get you going.  Need drugs to relate to people or feel comfortable in social situations.  Use drugs to forget problems. A "yes" answered to any of the above signs of chemical dependency indicates there are problems. The longer the use of drugs continues, the greater the problems will become. If there is a family history of drug or alcohol use it is best not to experiment with drugs. Experimentation leads to tolerance. Addiction is followed by dependency where drugs are now needed not just to get high but to feel normal. Addiction cannot be cured but it can be stopped. This often requires outside help and the care of professionals. Treatment centers are listed in the yellow pages under:  Cocaine, Narcotics, and Alcoholics anonymous. Most hospitals and clinics can refer you to a specialized care center. WHAT IS MARIJUANA? Marijuana is a plant which grows wild all over the world. The plant contains many chemicals but the active ingredient of the plant is THC (tetrahydrocannabinol). This is responsible for the "high" perceived by people using the drug. HOW IS MARIJUANA USED? Marijuana is smoked, eaten in brownies or any other food, and drank as a tea. WHAT ARE THE EFFECTS OF MARIJUANA? Marijuana is a nervous system depressant which slows the thinking process. Because of this effect, users think marijuana has a calming effect. Actually what happens is the air carrying tubules in the lung become relaxed and allow more oxygen to enter. This causes the user to feel high. The blood pressure falls so less blood reaches the brain and the heart speeds up. As the effects wear off the user becomes depressed. Some people become very paranoid during use. They feel as though people are out to get them. Periodic use can interfere with performance at school or work. Generally Marijuana use does not develop into a physical dependence, but it is very habit forming. Marijuana is also seen as a gateway to use of harder drugs. Strong habits such as using Marijuana, as with all drugs and addictions, can only be  helped by stopping use of all chemicals. This is hard but may save your life.  OTHER HEALTH RISKS OF MARIJUANA AND DRUG USE ARE: The increased possibility of getting AIDS or hepatitis (liver inflammation).  HOW TO STAY DRUG FREE ONCE YOU HAVE QUIT USING:  Develop healthy activities and form friends who do not use drugs.  Stay away from the drug scene.  Tell the those who want you to use drugs you have other, better things to do.  Have ready excuses available about why you cannot use.  Attend 12-Step Meetings for support from other recovering people. FOR MORE HELP OR INFORMATION CONTACT YOUR  LOCAL CAREGIVER, CLINIC, OR HOSPITAL. Document Released: 03/17/2000 Document Revised: 07/15/2012 Document Reviewed: 04/17/2007 [facility] Patient Information 2014 [place], [place].

## 2013-08-03 NOTE — ED Notes (Signed)
Attempted to get labs, unable, pt has been vomiting.

## 2013-08-03 NOTE — ED Notes (Signed)
Pt from home c/o central abdominal pain with nausea and vomiting. This has been ongoing according to the pt for 11 years. He has seen many specialist and cause is unknown. His vomiting has last for the past week. He rate is pain 10/10

## 2013-08-03 NOTE — ED Provider Notes (Signed)
CSN: [id number]     Arrival date & time 08/03/13  1513 History   First MD Initiated Contact with Patient 08/03/13 1651     Chief Complaint  Patient presents with  . Abdominal Pain     (Consider location/radiation/quality/duration/timing/severity/associated sxs/prior Treatment) HPI Lawrence Lutz is a 28 y.o. male who presents emergency department complaining of abdominal pain, nausea, vomiting. Patient states he has had symptoms for 1 week, gradually worsening. States that he has had similar episodes in the past, states has had this issue for 11 years. He has been seen in emergency department several times and has seen primary care doctor and gastroenterologist. States "I have had all sorts of tests done and they cannot figure out what is causing the symptoms." Patient denies any prior abdominal surgeries. He does not have any past medical problems. He has not tried taking anything for his symptoms. He states in the last several days he hasn't been able to keep any fluids or solids down. He denies any changes in his bowels. He reports weakness and lightheadedness. He does admit to smoking marijuana daily. No other drugs.   Past Medical History  Diagnosis Date  . Abdominal pain    No past surgical history on file. No family history on file. History  Substance Use Topics  . Smoking status: Never Smoker   . Smokeless tobacco: Not on file  . Alcohol Use: No    Review of Systems  Constitutional: Negative for fever and chills.  Respiratory: Negative for cough, chest tightness and shortness of breath.   Cardiovascular: Negative for chest pain, palpitations and leg swelling.  Gastrointestinal: Positive for nausea, vomiting and abdominal pain. Negative for diarrhea, blood in stool and abdominal distention.  Genitourinary: Negative for dysuria, urgency, frequency and hematuria.  Musculoskeletal: Negative for arthralgias, myalgias, neck pain and neck stiffness.  Skin: Negative for rash.   Allergic/Immunologic: Negative for immunocompromised state.  Neurological: Negative for dizziness, weakness, light-headedness, numbness and headaches.      Allergies  Morphine and related; Tramadol; Vicodin; and Pantoprazole sodium  Home Medications   Prior to Admission medications   Not on File   BP 128/95  Pulse 90  Temp(Src) 99.9 F (37.7 C) (Oral)  Resp 18  Ht 5\' 6"  (1.676 m)  Wt 185 lb (83.915 kg)  BMI 29.87 kg/m2  SpO2 96% Physical Exam  Nursing note and vitals reviewed. Constitutional: He is oriented to person, place, and time. He appears well-developed and well-nourished. No distress.  HENT:  Head: Normocephalic and atraumatic.  Eyes: Conjunctivae are normal.  Neck: Neck supple.  Cardiovascular: Normal rate, regular rhythm and normal heart sounds.   Pulmonary/Chest: Effort normal. No respiratory distress. He has no wheezes. He has no rales.  Abdominal: Soft. Bowel sounds are normal. He exhibits no distension. There is tenderness. There is no rebound and no guarding.  Diffuse tenderness  Musculoskeletal: He exhibits no edema.  Neurological: He is alert and oriented to person, place, and time.  Skin: Skin is warm and dry.    ED Course  Procedures (including critical care time) Labs Review Labs Reviewed  CBC WITH DIFFERENTIAL - Abnormal; Notable for the following:    RBC 6.52 (*)    Hemoglobin 17.7 (*)    HCT 52.3 (*)    Monocytes Absolute 1.1 (*)    All other components within normal limits  COMPREHENSIVE METABOLIC PANEL - Abnormal; Notable for the following:    Glucose, Bld 101 (*)    Total Protein 8.8 (*)  All other components within normal limits  URINALYSIS, ROUTINE W REFLEX MICROSCOPIC - Abnormal; Notable for the following:    Color, Urine AMBER (*)    Specific Gravity, Urine 1.037 (*)    Bilirubin Urine SMALL (*)    Ketones, ur >80 (*)    Protein, ur 30 (*)    All other components within normal limits  URINE MICROSCOPIC-ADD ON - Abnormal;  Notable for the following:    Bacteria, UA FEW (*)    All other components within normal limits  LIPASE, BLOOD  I-STAT TROPOININ, ED    Imaging Review Dg Abd Acute W/chest  08/03/2013   CLINICAL DATA:  Abdominal pain.  Vomiting.  EXAM: ACUTE ABDOMEN SERIES (ABDOMEN 2 VIEW & CHEST 1 VIEW)  COMPARISON:  CT, 05/15/2011  FINDINGS: Two closely approximated loops of mildly dilated small bowel are noted in the left upper quadrant. Bowel gas pattern is otherwise unremarkable. There are no significant air-fluid levels. No free air. Soft tissues are unremarkable.  Normal heart, mediastinum and hila.  Clear lungs.  The bony structures are unremarkable.  IMPRESSION: 1. Two closely approximated loops of mildly dilated small bowel in the left upper quadrant. This is nonspecific. It could reflect a mild focal ileus. 2. No convincing obstruction.  No free air.  No other abnormalities.   Electronically Signed   By: Amie Portlandavid  Ormond M.D.   On: 08/03/2013 17:54     EKG Interpretation None      MDM   Final diagnoses:  Cyclical vomiting  Cannabis dependence   Pt with nausea, vomiting, abdominal pain. Hx of the same. He is a heavy cannabis user. He has had multiple work ups including GI with no diagnosis. Will start fluids, anti emetics and pain medications ordered.     7:36 PM Pt feeling worse after toradol and reglan. Will try ativan and more dilaudid. Pt also asked for GI cocktail.   9:03 PM Feeling much better. Will do a PO trial.   Pt tolerating PO fluids. He is feeling better. Abdomen is benign. Home with close follow up. Will prescribe reglan and phenergan suppositories. Instructed to stop cannabis.    Filed Vitals:   08/03/13 1521 08/03/13 2000 08/03/13 2209  BP: 128/95 137/69 139/70  Pulse: 90 80 97  Temp: 99.9 F (37.7 C) 98.1 F (36.7 C) 98.2 F (36.8 C)  TempSrc: Oral Oral Oral  Resp: 18 18 18   Height: 5\' 6"  (1.676 m)    Weight: 185 lb (83.915 kg)    SpO2: 96% 97% 100%      Krystl Wickware A Julianny Milstein, PA-C 08/04/13 0140

## 2013-08-06 NOTE — ED Provider Notes (Signed)
Medical screening examination/treatment/procedure(s) were performed by non-physician practitioner and as supervising physician I was immediately available for consultation/collaboration.   Tamari Redwine R Merrell Borsuk, MD 08/06/13 1516 

## 2013-09-16 ENCOUNTER — Emergency Department (HOSPITAL_COMMUNITY): Payer: Self-pay

## 2013-09-16 ENCOUNTER — Encounter (HOSPITAL_COMMUNITY): Payer: Self-pay | Admitting: Emergency Medicine

## 2013-09-16 ENCOUNTER — Emergency Department (HOSPITAL_COMMUNITY)
Admission: EM | Admit: 2013-09-16 | Discharge: 2013-09-16 | Disposition: A | Discharge status: Home / Self Care | Payer: Self-pay | Attending: Emergency Medicine | Admitting: Emergency Medicine

## 2013-09-16 DIAGNOSIS — R1013 Epigastric pain: Secondary | ICD-10-CM | POA: Insufficient documentation

## 2013-09-16 DIAGNOSIS — R112 Nausea with vomiting, unspecified: Secondary | ICD-10-CM | POA: Insufficient documentation

## 2013-09-16 DIAGNOSIS — R109 Unspecified abdominal pain: Secondary | ICD-10-CM

## 2013-09-16 DIAGNOSIS — R079 Chest pain, unspecified: Secondary | ICD-10-CM | POA: Insufficient documentation

## 2013-09-16 DIAGNOSIS — G8929 Other chronic pain: Secondary | ICD-10-CM | POA: Insufficient documentation

## 2013-09-16 HISTORY — DX: Cannabis use, unspecified, uncomplicated: F12.90

## 2013-09-16 HISTORY — DX: Nausea with vomiting, unspecified: R11.2

## 2013-09-16 HISTORY — DX: Other chronic pain: G89.29

## 2013-09-16 HISTORY — DX: Unspecified abdominal pain: R10.9

## 2013-09-16 LAB — COMPREHENSIVE METABOLIC PANEL
ALK PHOS: 80 U/L (ref 39–117)
ALT: 11 U/L (ref 0–53)
AST: 15 U/L (ref 0–37)
Albumin: 4.3 g/dL (ref 3.5–5.2)
BUN: 9 mg/dL (ref 6–23)
CO2: 23 meq/L (ref 19–32)
Calcium: 9.6 mg/dL (ref 8.4–10.5)
Chloride: 102 mEq/L (ref 96–112)
Creatinine, Ser: 0.75 mg/dL (ref 0.50–1.35)
GFR calc Af Amer: >90 mL/min (ref >90)
GFR calc non Af Amer: >90 mL/min (ref >90)
Glucose, Bld: 113 mg/dL — ABNORMAL HIGH (ref 70–99)
Potassium: 3.9 mEq/L (ref 3.7–5.3)
SODIUM: 140 meq/L (ref 137–147)
TOTAL PROTEIN: 7.8 g/dL (ref 6.0–8.3)
Total Bilirubin: 0.6 mg/dL (ref 0.3–1.2)

## 2013-09-16 LAB — CBC WITH DIFFERENTIAL/PLATELET
BASOS PCT: 1 % (ref 0–1)
Basophils Absolute: 0.1 10*3/uL (ref 0.0–0.1)
Eosinophils Absolute: 0.1 10*3/uL (ref 0.0–0.7)
Eosinophils Relative: 1 % (ref 0–5)
HEMATOCRIT: 48 % (ref 39.0–52.0)
Hemoglobin: 16 g/dL (ref 13.0–17.0)
Lymphocytes Relative: 16 % (ref 12–46)
Lymphs Abs: 1.6 10*3/uL (ref 0.7–4.0)
MCH: 26.6 pg (ref 26.0–34.0)
MCHC: 33.3 g/dL (ref 30.0–36.0)
MCV: 79.9 fL (ref 78.0–100.0)
Monocytes Absolute: 0.7 10*3/uL (ref 0.1–1.0)
Monocytes Relative: 7 % (ref 3–12)
Neutro Abs: 7.2 10*3/uL (ref 1.7–7.7)
Neutrophils Relative %: 75 % (ref 43–77)
Platelets: 214 10*3/uL (ref 150–400)
RBC: 6.01 MIL/uL — ABNORMAL HIGH (ref 4.22–5.81)
RDW: 13.3 % (ref 11.5–15.5)
WBC: 9.7 10*3/uL (ref 4.0–10.5)

## 2013-09-16 LAB — RAPID URINE DRUG SCREEN, HOSP PERFORMED
Amphetamines: NOT DETECTED
BENZODIAZEPINES: NOT DETECTED
Barbiturates: NOT DETECTED
COCAINE: NOT DETECTED
OPIATES: POSITIVE — AB
Tetrahydrocannabinol: POSITIVE — AB

## 2013-09-16 LAB — LIPASE, BLOOD: Lipase: 21 U/L (ref 11–59)

## 2013-09-16 LAB — TROPONIN I: Troponin I: <0.3 ng/mL (ref <0.30)

## 2013-09-16 MED ORDER — SODIUM CHLORIDE 0.9 % IV BOLUS (SEPSIS)
500.0000 mL | Freq: Once | INTRAVENOUS | Status: AC
  Administered 2013-09-16: 500 mL via INTRAVENOUS

## 2013-09-16 MED ORDER — FENTANYL CITRATE 0.05 MG/ML IJ SOLN
100.0000 ug | Freq: Once | INTRAMUSCULAR | Status: AC
  Administered 2013-09-16: 100 ug via INTRAMUSCULAR
  Filled 2013-09-16: qty 2

## 2013-09-16 MED ORDER — PROMETHAZINE HCL 25 MG/ML IJ SOLN
25.0000 mg | Freq: Once | INTRAMUSCULAR | Status: AC
  Administered 2013-09-16: 25 mg via INTRAMUSCULAR
  Filled 2013-09-16: qty 1

## 2013-09-16 MED ORDER — SODIUM CHLORIDE 0.9 % IV SOLN
INTRAVENOUS | Status: DC
  Administered 2013-09-16: 20:00:00 via INTRAVENOUS

## 2013-09-16 MED ORDER — FAMOTIDINE IN NACL 20-0.9 MG/50ML-% IV SOLN
20.0000 mg | Freq: Once | INTRAVENOUS | Status: DC
  Filled 2013-09-16: qty 50

## 2013-09-16 MED ORDER — FENTANYL CITRATE 0.05 MG/ML IJ SOLN
100.0000 ug | INTRAMUSCULAR | Status: AC | PRN
  Administered 2013-09-16 (×2): 100 ug via INTRAVENOUS
  Filled 2013-09-16 (×2): qty 2

## 2013-09-16 MED ORDER — PROMETHAZINE HCL 25 MG RE SUPP: 25.0000 mg | Freq: Four times a day (QID) | RECTAL | Status: DC | PRN

## 2013-09-16 MED ORDER — OXYCODONE-ACETAMINOPHEN 5-325 MG PO TABS: ORAL_TABLET | ORAL | Status: DC

## 2013-09-16 MED ORDER — ONDANSETRON 8 MG PO TBDP
8.0000 mg | ORAL_TABLET | Freq: Once | ORAL | Status: AC
  Administered 2013-09-16: 8 mg via ORAL
  Filled 2013-09-16: qty 1

## 2013-09-16 MED ORDER — GI COCKTAIL ~~LOC~~
30.0000 mL | Freq: Once | ORAL | Status: AC
  Administered 2013-09-16: 30 mL via ORAL
  Filled 2013-09-16: qty 30

## 2013-09-16 NOTE — ED Notes (Signed)
Provided pt with urinal. Pt has been vomiting and hasn't been able to collect. Explained to let me know when he could void

## 2013-09-16 NOTE — ED Notes (Signed)
Patient reports he started with mid to lower chest upper abdominal pain about a week ago.  Pain came on suddenly associated with vomiting, no diarrhea.  Patient describes pain as throbbing.  Patient reports he has been seeing a GI specialist in [place] last year.  Patient says he has been suffering with this for eleven years.  Patient vomiting now while nurse in the room.

## 2013-09-16 NOTE — ED Notes (Signed)
Pt reports midline chest and upper abd pain for last week. Worse day before yesterday. Pt throwing up green emesis in triage emesis started last night. Pt states he has been to GI specialist, but unsure what it is.

## 2013-09-16 NOTE — Progress Notes (Signed)
  CARE MANAGEMENT ED NOTE 09/16/2013  Patient:  Lawrence Lutz,Lawrence Lutz   Account Number:  [id number]  Date Initiated:  09/16/2013  Documentation initiated by:  Radford PaxFERRERO,Buel Molder  Subjective/Objective Assessment:   Patient presents to Ed with abdominla pain and nausea     Subjective/Objective Assessment Detail:     Action/Plan:   Action/Plan Detail:   Anticipated DC Date:  09/16/2013     Status Recommendation to Physician:   Result of Recommendation:    Other ED Services  Consult Working Plan    DC Planning Services  Other  PCP issues    Choice offered to / List presented to:            Status of service:  Completed, signed off  ED Comments:   ED Comments Detail:  EDCM spoke to patient at bedside.  Patient confirms he does not have  a pcp [place]. [facility] provided patient with list of pcps who acccept self pay patients, phone number and address  of  [facility], informed patient that [facility] from [facility]-Thurs from 9am-1030am. Provided patient with phone number to inquire about the orange card and where to enroll, provided phone number to inquire about Medicaid and Affordable Care Act for insurance, financial resources in the ocmmunity sucha as local churches, [employer], urban ministries, and dental assistance for uninsured patients.  Patient thankful for resources.  No further EDCM needs at this time.

## 2013-09-16 NOTE — Discharge Instructions (Signed)
°Emergency Department Resource Guide °1) Find a Doctor and Pay Out of Pocket °Although you won't have to find out who is covered by your insurance plan, it is a good idea to ask around and get recommendations. You will then need to call the office and see if the doctor you have chosen will accept you as a new patient and what types of options they offer for patients who are self-pay. Some doctors offer discounts or will set up payment plans for their patients who do not have insurance, but you will need to ask so you aren't surprised when you get to your appointment. ° °2) Contact Your Local Health Department °Not all health departments have doctors that can see patients for sick visits, but many do, so it is worth a call to see if yours does. If you don't know where your local health department is, you can check in your phone book. The CDC also has a tool to help you locate your state's health department, and many state websites also have listings of all of their local health departments. ° °3) Find a Walk-in Clinic °If your illness is not likely to be very severe or complicated, you may want to try a walk in clinic. These are popping up all over the country in pharmacies, drugstores, and shopping centers. They're usually staffed by nurse practitioners or physician assistants that have been trained to treat common illnesses and complaints. They're usually fairly quick and inexpensive. However, if you have serious medical issues or chronic medical problems, these are probably not your best option. ° °No Primary Care Doctor: °- Call [facility] at  832-8000 - they can help you locate a primary care doctor that  accepts your insurance, provides certain services, etc. °- Physician Referral Service- 1-800-533-3463 ° °Chronic Pain Problems: °Organization         Address  Phone   Notes  °[facility]  (336) 297-2271 Patients need to be referred by their primary care doctor.  ° °Medication  Assistance: °Organization         Address  Phone   Notes  °[place] [facility] [address] °[place], [place] [address] (336) 641-8030 --Must be a resident of [place] °-- Must have NO insurance coverage whatsoever (no Medicaid/ Medicare, etc.) °-- The pt. MUST have a primary care doctor that directs their care regularly and follows them in the community °  °[facility]  (866) 331-1348   °[employer]  (888) 892-1162   ° °Agencies that provide inexpensive medical care: °Organization         Address  Phone   Notes  °[facility]  (336) 832-8035   °[facility]    (336) 832-7272   °[facility] [address] °[place], [place] [address] (336) 832-4777   °[facility] [address], °[place] (336) 271-4999   °[facility]    (336) 373-0678   °[facility]    (336) 272-1050   °[facility] ° [address], [place] Phone:  (336) 832-4444, Fax:  (336) 832-4440 Hours of Operation:  9 am - 6 pm, M-F.  Also accepts Medicaid/Medicare and self-pay.  °[facility] ° [address], [place] Phone: (336) [phone], Fax: (336) [phone]. Hours of Operation:  8:30 am - 5:30 pm, M-F.  Also accepts Medicaid and self-pay.  °[facility] [place] [address], [place] Phone: (336) 878-6027   °[facility] [address], [place], [place] (336)723-1848, Ext. 123 Mondays & Thursdays: 7-9 AM.  First 15 patients are seen on a first come, first serve basis. °  ° °Medicaid-accepting [place] Providers: ° °Organization         Address  Phone   Notes  °[facility] [address], [place] (336) 641-2100 Also accepts self-pay patients.  °[facility] [address], [place] ° (336) 856-9996   °[facility] [address], [place]  (336) 288-8857   °[facility] [address] [place] [address], [place] (336) 299-7000   °Veita Bland [address], [place]  ° (336) 373-1557 Only accepts [place] Access Medicaid patients after they have their name applied to their card.  ° °Self-Pay (no insurance) in [place]: ° °Organization         Address  Phone   Notes  °Sickle Cell Patients, [facility] [address], [place] (336) 832-1970   °[facility] [address], [place] (336) 832-4400   °[facility] ° [address], [place] (336) 992-4800   °[facility]/Dr. Osei-Bonsu ° [address] [place] [address], [place] or [address], [place] (336) 841-8500 Phone number for both [place] and [place] locations is the same.  °[facility] [address], [place] (336) 299-0000   °[facility] [address] [place] [address], [place] or [address] (336) 852-7530 °(336) 878-2260   °[facility] [address], [place] (336) 350-1642, phone; (336) 294-5005, fax Sees patients 1st and 3rd Saturday of every month.  Must not qualify for public or private insurance (i.e. Medicaid, Medicare, [place] Health Choice, Veterans' Benefits) • Household income should be no more than 200% of the poverty level •The clinic cannot treat you if you are pregnant or think you are pregnant • Sexually transmitted diseases are not treated at the clinic.  ° ° °Dental Care: °Organization         Address  Phone  Notes  °[place] [facility] [address], [place] (336) 641-6152 Accepts children up to age 21 who are enrolled in Medicaid or [place] Health Choice; pregnant women with a Medicaid card; and children who have applied for Medicaid or [place] Health Choice, but were declined, whose parents can pay a reduced fee at time of service.  °[place]  [facility] [place]  [address], [place] (336) 641-7733 Accepts children up to age 21 who are enrolled in Medicaid or [place] Health Choice; pregnant women with a Medicaid card; and children who have applied for Medicaid or [place] Health Choice, but were declined, whose parents can pay a reduced fee at time of service.  °[facility] ° [address], [place] (336) 641-4533 Patients are seen by appointment only. Walk-ins are not accepted. [facility] will see patients 18 years of age and older. °Monday - Tuesday (8am-5pm) °Most Wednesdays (8:30-5pm) °$30 per visit, cash only  °[facility] ° [address], [place] (336) 641-4533 Patients are seen by appointment only. Walk-ins are not accepted. [facility] will see patients 18 years of age and older. °One   Wednesday Evening (Monthly: Volunteer Based).  $30 per visit, cash only  °[facility]  (919) 537-3737 for adults; Children under age 4, call Graduate Pediatric Dentistry at (919) [phone]. Children aged 4-14, please call (919) [phone] to request a pediatric application. ° Dental services are provided in all areas of dental care including fillings, crowns and bridges, complete and partial dentures, implants, gum treatment, root canals, and extractions. Preventive care is also provided. Treatment is provided to both adults and children. °Patients are selected via a lottery and there is often a waiting list. °  °[facility] [address], °[place] ° (336) 763-8833 [email or web address] °  °[facility] [address], [place], [place] (336)723-1848, Ext. 123 Second and Fourth Thursday of each month, opens at 6:30 AM; Clinic ends at 9 AM.  Patients are seen on a first-come first-served basis, and a limited number are seen during each clinic.  ° °[facility] ° [address], [place], [place] (336) 723-7904    Eligibility Requirements °You must have lived in [place], [place], or [place] counties for at least the last three months. °  You cannot be eligible for state or federal sponsored healthcare insurance, including Veterans Administration, Medicaid, or Medicare. °  You generally cannot be eligible for healthcare insurance through your employer.  °  How to apply: °Eligibility screenings are held every Tuesday and Wednesday afternoon from 1:00 pm until 4:00 pm. You do not need an appointment for the interview!  °[facility] [address], [place], [place] 336-631-2330   °[facility]  336-342-8273   °[place]  336-703-3100   °[facility]  336-570-6415   ° °Behavioral Health Resources in the Community: °Intensive Outpatient Programs °Organization         Address  Phone  Notes  °[facility] [address], [place], [place] 336-878-6098   °[facility] [address], [place], [place] 336-832-9800   °[facility] [address], [place], [place] ° 336-882-2125   °[facility] [address],  °[place], [place] 1-800-853-5163 or 336-641-4981   °Substance Abuse Resources °Organization         Address  Phone  Notes  °[facility]  336-882-2125   °[facility]  336-784-9470   °[facility]  336-285-9073   °[facility]  336-845-3988   °Residential & Outpatient Substance Abuse Program  1-800-659-3381   °Psychological Services °Organization         Address  Phone  Notes  °[facility]  336- 832-9600   °[facility]  336- 378-7881   °[facility] [address], [place] 1-800-853-5163 or 336-641-4981   ° °Mobile Crisis Teams °Organization         Address  Phone  Notes  °[facility], Mobile Crisis Care Unit  1-877-626-1772   °Assertive °[facility] ° [address]  [place], [place] 336-834-9664   °Sharon DeEsch [address] °[place] 336-554-5454   ° °Self-Help/Support Groups °Organization         Address  Phone             Notes  °[facility] of  - variety of support groups  336- 373-1402 Call for more information  °Narcotics Anonymous (NA), [facility] [address], °[place]  2 meetings at this location  ° °  Residential Treatment Programs Organization         Address  Phone  Notes  [facility] [address],    [place]  [phone]   [facility]  [address], [place] [phone], [place], [place] [phone]   [facility] [address], [place] [phone]) [phone] Admissions: 8am-3pm M-F  [facility] [address]    [place], [place] [phone]   [facility] [address], [place], [place] [phone]   [facility] [address]  [place], [place] [phone]   [facility] Intensive Outpatient [address], [place], [place] [phone]   [facility] ([facility] [address]  [place], [place] [phone] or (226) [phone]   [facility] ([facility] [address] [place], [place] [phone])[phone] Accepts Medicaid  [facility] [address]  [place] [phone]) [phone] Substance Abuse/Addiction Treatment   [facility]         Address  Phone  Notes  [facility]  (905) 702-8785(888) ([phone])[phone]   Angie FavaJulie Brannon, PhD [address], [address] [place], [place]   [phone](336) [phone] or (984)[phone](336) [phone]   [facility] [place] [facility]   [address] [place], [place] [phone](336) [phone]   [facility] [address], [place], [place] [phone](336) [phone] Insurance/Medicaid/sponsorship through [facility]  [facility] [address]                                    [place], [place] [phone]) [phone] Therapy/tele-psych/case    [facility] [address]   [place], [place] [phone]) [phone]    Dr. Lolly MustacheArfeen  [phone](336) [phone]   [facility] [place]  [employer] [facility] 1) [address], [place] 2) [address], [place] 3)  [address], [place] [phone]) [phone](336) [phone]  [phone](336) [phone]   [facility] Child [facility] (587)228-8840(336) 6577733153 or [phone](336) [phone] (After Hours)       Take the prescriptions as directed.  Increase your fluid intake (ie:  Gatoraide) for the next few days.  Eat a bland diet and advance to your regular diet slowly as you can tolerate it. Call your regular medical doctor and the GI doctor tomorrow to schedule a follow up appointment in the next 3 days.  Return to the Emergency Department immediately if not improving (or even worsening) despite taking the medicines as prescribed, any black or bloody stool or vomit, if you develop a fever over "101," or for any other concerns.

## 2013-09-16 NOTE — ED Provider Notes (Signed)
CSN: [id number]     Arrival date & time 09/16/13  1648 History   First MD Initiated Contact with Patient 09/16/13 1658     Chief Complaint  Patient presents with  . Chest Pain  . Abdominal Pain  . Emesis      HPI Pt was seen at 1710.  Per pt, c/o gradual onset and persistence of constant acute flair of his chronic upper abd "pain" for the past 1 week. Has been associated with multiple intermittent episodes of N/V.  Describes the abd pain as "throbbing," with radiation into his lower chest. Denies diarrhea, no fevers, no back pain, no rash, no CP/SOB, no black or blood in stools or emesis.  The symptoms have been associated with no other complaints. The patient has a significant history of similar symptoms previously, recently being evaluated for this complaint and multiple prior evals for same.  Pt states he has [facility] extensively [facility] without definitive dx.      Past Medical History  Diagnosis Date  . Chronic abdominal pain   . Nausea and vomiting     recurrent  . Marijuana use    History reviewed. No pertinent past surgical history.  History  Substance Use Topics  . Smoking status: Never Smoker   . Smokeless tobacco: Not on file  . Alcohol Use: No    Review of Systems ROS: Statement: All systems negative except as marked or noted in the HPI; Constitutional: Negative for fever and chills. ; ; Eyes: Negative for eye pain, redness and discharge. ; ; ENMT: Negative for ear pain, hoarseness, nasal congestion, sinus pressure and sore throat. ; ; Cardiovascular: Negative for chest pain, palpitations, diaphoresis, dyspnea and peripheral edema. ; ; Respiratory: Negative for cough, wheezing and stridor. ; ; Gastrointestinal: +N/V, abd pain. Negative for diarrhea, blood in stool, hematemesis, jaundice and rectal bleeding. . ; ; Genitourinary: Negative for dysuria, flank pain and hematuria. ; ; Musculoskeletal: Negative for back pain and  neck pain. Negative for swelling and trauma.; ; Skin: Negative for pruritus, rash, abrasions, blisters, bruising and skin lesion.; ; Neuro: Negative for headache, lightheadedness and neck stiffness. Negative for weakness, altered level of consciousness , altered mental status, extremity weakness, paresthesias, involuntary movement, seizure and syncope.      Allergies  Morphine and related; Tramadol; Vicodin; and Pantoprazole sodium  Home Medications   Prior to Admission medications   Not on File   BP 173/106  Pulse 77  Temp(Src) 100 F (37.8 C) (Oral)  Resp 21  SpO2 96% Physical Exam 1715: Physical examination:  Nursing notes reviewed; Vital signs and O2 SAT reviewed;  Constitutional: Well developed, Well nourished, Well hydrated, Uncomfortable appearing.; Head:  Normocephalic, atraumatic; Eyes: EOMI, PERRL, No scleral icterus; ENMT: Mouth and pharynx normal, Mucous membranes moist; Neck: Supple, Full range of motion, No lymphadenopathy; Cardiovascular: Regular rate and rhythm, No murmur, rub, or gallop; Respiratory: Breath sounds clear & equal bilaterally, No rales, rhonchi, wheezes.  Speaking full sentences with ease, Normal respiratory effort/excursion; Chest: Nontender, Movement normal; Abdomen: Soft, +mid-epigastric tenderness to palp. Nondistended, Normal bowel sounds; Genitourinary: No CVA tenderness; Extremities: Pulses normal, No tenderness, No edema, No calf edema or asymmetry.; Neuro: AA&Ox3, Major CN grossly intact.  Speech clear. No gross focal motor or sensory deficits in extremities.; Skin: Color normal, Warm, Dry.   ED Course  Procedures    EKG Interpretation   Date/Time:  Tuesday September 16 2013 16:53:46 EDT Ventricular  Rate:  72 PR Interval:  136 QRS Duration: 83 QT Interval:  403 QTC Calculation: 441 R Axis:   81 Text Interpretation:  Sinus rhythm Anteroseptal infarct, old ST elevation,  consider early repolarization When compared with ECG of 07/07/2011 No   significant change was found Confirmed by [facility]  MD, Nicholos Johns [phone])  on 09/16/2013 5:30:37 PM      MDM  MDM Reviewed: previous chart, nursing note and vitals Reviewed previous: labs, x-ray, ultrasound, CT scan and ECG Interpretation: labs, x-ray, ultrasound and ECG    Results for orders placed during the hospital encounter of 09/16/13  URINE RAPID DRUG SCREEN (HOSP PERFORMED)      Result Value Ref Range   Opiates POSITIVE (*) NONE DETECTED   Cocaine NONE DETECTED  NONE DETECTED   Benzodiazepines NONE DETECTED  NONE DETECTED   Amphetamines NONE DETECTED  NONE DETECTED   Tetrahydrocannabinol POSITIVE (*) NONE DETECTED   Barbiturates NONE DETECTED  NONE DETECTED  CBC WITH DIFFERENTIAL      Result Value Ref Range   WBC 9.7  4.0 - 10.5 K/uL   RBC 6.01 (*) 4.22 - 5.81 MIL/uL   Hemoglobin 16.0  13.0 - 17.0 g/dL   HCT 60.4  54.0 - 98.1 %   MCV 79.9  78.0 - 100.0 fL   MCH 26.6  26.0 - 34.0 pg   MCHC 33.3  30.0 - 36.0 g/dL   RDW 19.1  47.8 - 29.5 %   Platelets 214  150 - 400 K/uL   Neutrophils Relative % 75  43 - 77 %   Neutro Abs 7.2  1.7 - 7.7 K/uL   Lymphocytes Relative 16  12 - 46 %   Lymphs Abs 1.6  0.7 - 4.0 K/uL   Monocytes Relative 7  3 - 12 %   Monocytes Absolute 0.7  0.1 - 1.0 K/uL   Eosinophils Relative 1  0 - 5 %   Eosinophils Absolute 0.1  0.0 - 0.7 K/uL   Basophils Relative 1  0 - 1 %   Basophils Absolute 0.1  0.0 - 0.1 K/uL  COMPREHENSIVE METABOLIC PANEL      Result Value Ref Range   Sodium 140  137 - 147 mEq/L   Potassium 3.9  3.7 - 5.3 mEq/L   Chloride 102  96 - 112 mEq/L   CO2 23  19 - 32 mEq/L   Glucose, Bld 113 (*) 70 - 99 mg/dL   BUN 9  6 - 23 mg/dL   Creatinine, Ser 6.21  0.50 - 1.35 mg/dL   Calcium 9.6  8.4 - 30.8 mg/dL   Total Protein 7.8  6.0 - 8.3 g/dL   Albumin 4.3  3.5 - 5.2 g/dL   AST 15  0 - 37 U/L   ALT 11  0 - 53 U/L   Alkaline Phosphatase 80  39 - 117 U/L   Total Bilirubin 0.6  0.3 - 1.2 mg/dL   GFR calc non Af Amer >90  >90  mL/min   GFR calc Af Amer >90  >90 mL/min  LIPASE, BLOOD      Result Value Ref Range   Lipase 21  11 - 59 U/L  TROPONIN I      Result Value Ref Range   Troponin I <0.30  <0.30 ng/mL   US Abdomen Complete 09/16/2013   CLINICAL DATA:  Epigastric and lower abdominal pain  EXAM: ULTRASOUND ABDOMEN COMPLETE  COMPARISON:  CT 05/15/2011  FINDINGS: Gallbladder:  No gallstones  or wall thickening visualized. No sonographic Murphy sign noted.  Common bile duct:  Diameter: Normal at 2 mm  Liver:  No focal lesion identified. Within normal limits in parenchymal echogenicity.  IVC:  No abnormality visualized.  Pancreas:  Visualized portion unremarkable.  Spleen:  Size and appearance within normal limits.  Right Kidney:  Length: 11.8 cm. Echogenicity within normal limits. No mass or hydronephrosis visualized.  Left Kidney:  Length: 11.9 cm. Echogenicity within normal limits. No mass or hydronephrosis visualized.  Abdominal aorta:  No aneurysm visualized.  Other findings:  No ascites  IMPRESSION: No acute abdominal findings by ultrasound   Electronically Signed   By: Genevive BiStewart  Edmunds M.D.   On: 09/16/2013 19:59   Dg Abd Acute W/chest 09/16/2013   CLINICAL DATA:  Chest in abdomen pain.  Nausea, vomiting and chills.  EXAM: ACUTE ABDOMEN SERIES (ABDOMEN 2 VIEW & CHEST 1 VIEW)  COMPARISON:  08/03/2013.  FINDINGS: There is no evidence of dilated bowel loops or free intraperitoneal air. No radiopaque calculi or other significant radiographic abnormality is seen. Heart size and mediastinal contours are within normal limits. Both lungs are clear.  IMPRESSION: Normal examination.   Electronically Signed   By: Gordan PaymentSteve  Reid M.D.   On: 09/16/2013 18:00    2230:  Doubt PE as cause for symptoms with low risk [place].  Doubt ACS as cause for symptoms with normal troponin and unchanged EKG from previous after 1 week of constant symptoms.  Pt has tol PO well while in the ED without N/V.  No stooling while in the ED.  Abd benign, VSS.  States he feels better and wants to go home now. [place] Controlled Substance Database accessed: no recent rx on file. Workup reassuring, will tx symptomatically at this time; strongly encouraged to f/u with GI MD. Dx and testing d/w pt.  Questions answered.  Verb understanding, agreeable to d/c home with outpt f/u.    Laray AngerKathleen M McManus, DO 09/18/13 1744

## 2014-07-31 ENCOUNTER — Encounter (HOSPITAL_COMMUNITY): Payer: Self-pay | Admitting: Emergency Medicine

## 2014-07-31 ENCOUNTER — Emergency Department (HOSPITAL_COMMUNITY)
Admission: EM | Admit: 2014-07-31 | Discharge: 2014-08-01 | Discharge status: Against Medical Advice | Payer: Self-pay | Attending: Emergency Medicine | Admitting: Emergency Medicine

## 2014-07-31 DIAGNOSIS — R079 Chest pain, unspecified: Secondary | ICD-10-CM | POA: Insufficient documentation

## 2014-07-31 DIAGNOSIS — R1030 Lower abdominal pain, unspecified: Secondary | ICD-10-CM | POA: Insufficient documentation

## 2014-07-31 DIAGNOSIS — Z720 Tobacco use: Secondary | ICD-10-CM | POA: Insufficient documentation

## 2014-07-31 DIAGNOSIS — G8929 Other chronic pain: Secondary | ICD-10-CM | POA: Insufficient documentation

## 2014-07-31 LAB — CBC WITH DIFFERENTIAL/PLATELET
Basophils Absolute: 0.1 10*3/uL (ref 0.0–0.1)
Basophils Relative: 1 % (ref 0–1)
EOS PCT: 2 % (ref 0–5)
Eosinophils Absolute: 0.2 10*3/uL (ref 0.0–0.7)
HCT: 53.9 % — ABNORMAL HIGH (ref 39.0–52.0)
Hemoglobin: 18.5 g/dL — ABNORMAL HIGH (ref 13.0–17.0)
LYMPHS PCT: 34 % (ref 12–46)
Lymphs Abs: 3.1 10*3/uL (ref 0.7–4.0)
MCH: 26.9 pg (ref 26.0–34.0)
MCHC: 34.3 g/dL (ref 30.0–36.0)
MCV: 78.5 fL (ref 78.0–100.0)
MONO ABS: 1.4 10*3/uL — AB (ref 0.1–1.0)
MONOS PCT: 15 % — AB (ref 3–12)
Neutro Abs: 4.4 10*3/uL (ref 1.7–7.7)
Neutrophils Relative %: 48 % (ref 43–77)
Platelets: 278 10*3/uL (ref 150–400)
RBC: 6.87 MIL/uL — ABNORMAL HIGH (ref 4.22–5.81)
RDW: 12.9 % (ref 11.5–15.5)
WBC: 9.2 10*3/uL (ref 4.0–10.5)

## 2014-07-31 LAB — I-STAT TROPONIN, ED: Troponin i, poc: 0 ng/mL (ref 0.00–0.08)

## 2014-07-31 NOTE — ED Notes (Addendum)
Pt c/o mid lower abdominal pain that radiates up into his mid chest x 1 week.  Pt sts he has had these episodes of the same pain for 12 years and no doctor has been able to figure out what is going on. Pt denies taking regular medications for it. Pt sts the pain feels like "a sledge hammer" to his abdomen and chest. Pt sts after he attempts to eat or drink he feels a burning sensation from abdomen to chest. Pt c/o N/V. Pt sts that yesterday his vomit was blood tinged. Pt A&Ox4. Denies fever, diarrhea. Denies Dizziness and lightheadedness. Pt c/o weakness and SOB since the pain started a week ago. Pt sts he uses marijuana daily, last time was earlier this afternoon.

## 2014-08-01 LAB — COMPREHENSIVE METABOLIC PANEL
ALK PHOS: 79 U/L (ref 39–117)
ALT: 10 U/L (ref 0–53)
AST: 13 U/L (ref 0–37)
Albumin: 4.7 g/dL (ref 3.5–5.2)
Anion gap: 10 (ref 5–15)
BUN: 19 mg/dL (ref 6–23)
CHLORIDE: 93 mmol/L — AB (ref 96–112)
CO2: 28 mmol/L (ref 19–32)
CREATININE: 0.83 mg/dL (ref 0.50–1.35)
Calcium: 9.7 mg/dL (ref 8.4–10.5)
GFR calc Af Amer: >90 mL/min (ref >90)
Glucose, Bld: 116 mg/dL — ABNORMAL HIGH (ref 70–99)
Potassium: 3.8 mmol/L (ref 3.5–5.1)
Sodium: 131 mmol/L — ABNORMAL LOW (ref 135–145)
Total Bilirubin: 3.3 mg/dL — ABNORMAL HIGH (ref 0.3–1.2)
Total Protein: 8.4 g/dL — ABNORMAL HIGH (ref 6.0–8.3)

## 2014-08-01 LAB — LIPASE, BLOOD: LIPASE: 24 U/L (ref 11–59)

## 2014-08-01 NOTE — ED Notes (Signed)
No answer when called patient to obtain recheck vitals.

## 2014-08-04 ENCOUNTER — Encounter (HOSPITAL_COMMUNITY): Payer: Self-pay | Admitting: Emergency Medicine

## 2014-08-04 ENCOUNTER — Emergency Department (HOSPITAL_COMMUNITY)
Admission: EM | Admit: 2014-08-04 | Discharge: 2014-08-04 | Disposition: A | Discharge status: Home / Self Care | Payer: Self-pay | Attending: Emergency Medicine | Admitting: Emergency Medicine

## 2014-08-04 ENCOUNTER — Emergency Department (HOSPITAL_COMMUNITY): Payer: Self-pay

## 2014-08-04 DIAGNOSIS — R000 Tachycardia, unspecified: Secondary | ICD-10-CM | POA: Insufficient documentation

## 2014-08-04 DIAGNOSIS — R1915 Other abnormal bowel sounds: Secondary | ICD-10-CM | POA: Insufficient documentation

## 2014-08-04 DIAGNOSIS — G8929 Other chronic pain: Secondary | ICD-10-CM | POA: Insufficient documentation

## 2014-08-04 DIAGNOSIS — E860 Dehydration: Secondary | ICD-10-CM | POA: Insufficient documentation

## 2014-08-04 DIAGNOSIS — K3184 Gastroparesis: Secondary | ICD-10-CM | POA: Insufficient documentation

## 2014-08-04 DIAGNOSIS — Z720 Tobacco use: Secondary | ICD-10-CM | POA: Insufficient documentation

## 2014-08-04 DIAGNOSIS — E876 Hypokalemia: Secondary | ICD-10-CM | POA: Insufficient documentation

## 2014-08-04 LAB — HEPATIC FUNCTION PANEL
ALT: 10 U/L — AB (ref 17–63)
AST: 16 U/L (ref 15–41)
Albumin: 4.5 g/dL (ref 3.5–5.0)
Alkaline Phosphatase: 77 U/L (ref 38–126)
BILIRUBIN TOTAL: 2.5 mg/dL — AB (ref 0.3–1.2)
Bilirubin, Direct: 0.3 mg/dL (ref 0.1–0.5)
Indirect Bilirubin: 2.2 mg/dL — ABNORMAL HIGH (ref 0.3–0.9)
TOTAL PROTEIN: 7.7 g/dL (ref 6.5–8.1)

## 2014-08-04 LAB — CBC
HEMATOCRIT: 51.2 % (ref 39.0–52.0)
HEMOGLOBIN: 17.5 g/dL — AB (ref 13.0–17.0)
MCH: 26.4 pg (ref 26.0–34.0)
MCHC: 34.2 g/dL (ref 30.0–36.0)
MCV: 77.1 fL — ABNORMAL LOW (ref 78.0–100.0)
Platelets: 253 10*3/uL (ref 150–400)
RBC: 6.64 MIL/uL — ABNORMAL HIGH (ref 4.22–5.81)
RDW: 12.5 % (ref 11.5–15.5)
WBC: 8.1 10*3/uL (ref 4.0–10.5)

## 2014-08-04 LAB — BASIC METABOLIC PANEL
Anion gap: 11 (ref 5–15)
BUN: 23 mg/dL — ABNORMAL HIGH (ref 6–20)
CHLORIDE: 90 mmol/L — AB (ref 101–111)
CO2: 29 mmol/L (ref 22–32)
Calcium: 9.4 mg/dL (ref 8.9–10.3)
Creatinine, Ser: 0.75 mg/dL (ref 0.61–1.24)
GFR calc non Af Amer: >60 mL/min (ref >60)
Glucose, Bld: 224 mg/dL — ABNORMAL HIGH (ref 70–99)
POTASSIUM: 3.1 mmol/L — AB (ref 3.5–5.1)
SODIUM: 130 mmol/L — AB (ref 135–145)

## 2014-08-04 LAB — I-STAT TROPONIN, ED: Troponin i, poc: 0 ng/mL (ref 0.00–0.08)

## 2014-08-04 LAB — BRAIN NATRIURETIC PEPTIDE: B Natriuretic Peptide: 8.4 pg/mL (ref 0.0–100.0)

## 2014-08-04 LAB — LIPASE, BLOOD: Lipase: 33 U/L (ref 22–51)

## 2014-08-04 MED ORDER — DICYCLOMINE HCL 10 MG/ML IM SOLN
20.0000 mg | Freq: Once | INTRAMUSCULAR | Status: AC
  Administered 2014-08-04: 20 mg via INTRAMUSCULAR
  Filled 2014-08-04: qty 2

## 2014-08-04 MED ORDER — PROMETHAZINE HCL 25 MG RE SUPP: 25.0000 mg | Freq: Four times a day (QID) | RECTAL | Status: AC | PRN

## 2014-08-04 MED ORDER — HYDROMORPHONE HCL 1 MG/ML IJ SOLN
1.0000 mg | Freq: Once | INTRAMUSCULAR | Status: AC
  Administered 2014-08-04: 1 mg via INTRAVENOUS
  Filled 2014-08-04: qty 1

## 2014-08-04 MED ORDER — PROMETHAZINE HCL 25 MG/ML IJ SOLN
25.0000 mg | Freq: Once | INTRAMUSCULAR | Status: AC
  Administered 2014-08-04: 25 mg via INTRAVENOUS
  Filled 2014-08-04: qty 1

## 2014-08-04 MED ORDER — ONDANSETRON HCL 4 MG/2ML IJ SOLN
4.0000 mg | Freq: Once | INTRAMUSCULAR | Status: AC
  Administered 2014-08-04: 4 mg via INTRAVENOUS
  Filled 2014-08-04: qty 2

## 2014-08-04 MED ORDER — DICYCLOMINE HCL 20 MG PO TABS: 20.0000 mg | ORAL_TABLET | Freq: Two times a day (BID) | ORAL | Status: AC

## 2014-08-04 MED ORDER — POTASSIUM CHLORIDE CRYS ER 20 MEQ PO TBCR
40.0000 meq | EXTENDED_RELEASE_TABLET | Freq: Once | ORAL | Status: AC
  Administered 2014-08-04: 40 meq via ORAL
  Filled 2014-08-04: qty 2

## 2014-08-04 MED ORDER — SODIUM CHLORIDE 0.9 % IV BOLUS (SEPSIS)
1000.0000 mL | Freq: Once | INTRAVENOUS | Status: AC
  Administered 2014-08-04: 1000 mL via INTRAVENOUS

## 2014-08-04 MED ORDER — SODIUM CHLORIDE 0.9 % IV SOLN: INTRAVENOUS | Status: DC

## 2014-08-04 NOTE — ED Notes (Signed)
Pt c/o low central chest pain, SOB, nausea, emesis, epigastric pain, and weakness x 3 weeks.

## 2014-08-04 NOTE — ED Provider Notes (Signed)
CSN: [id number]     Arrival date & time 08/04/14  1406 History   First MD Initiated Contact with Patient 08/04/14 1501     Chief Complaint  Patient presents with  . Chest Pain  . Abdominal Pain     (Consider location/radiation/quality/duration/timing/severity/associated sxs/prior Treatment) HPI   29 year old male with history of chronic abdominal pain and recurrent nausea and vomiting who presents for evaluation of abdominal pain. Patient reports for the past 12 years he has had recurrent diffuse abdominal pain and despite going through many specialists they're unable to identify the source of his problem. For the past 3 weeks he is having persistent pain which he described as "getting hit by a sledgehammer" throughout his abdomen and his mid chest. Pain is worsened after eating or drinking. He is unable to keep any fluid or food down. Report weight loss, feeling lightheadedness, dizziness, fatigue, having increased shortness of breath with exertion, burning pain to his midsternal, and persistent nausea. This pain is very similar to his chronic pain. He cannot keep anything down. He admits to using marijuana but states that even without using it this pain is still there. He denies any prior abdominal surgery. He reports decreased urinary output and bowel movement because he cannot keep anything down. He does not have a primary care provider.  Past Medical History  Diagnosis Date  . Chronic abdominal pain   . Nausea and vomiting     recurrent  . Marijuana use    History reviewed. No pertinent past surgical history. No family history on file. History  Substance Use Topics  . Smoking status: Current Some Day Smoker -- 1.00 packs/day    Types: Cigarettes  . Smokeless tobacco: Not on file  . Alcohol Use: No    Review of Systems  All other systems reviewed and are negative.     Allergies  Morphine and related; Tramadol; Vicodin; and Pantoprazole sodium  Home Medications   Prior to  Admission medications   Medication Sig Start Date End Date Taking? Authorizing Provider  oxyCODONE-acetaminophen (PERCOCET/ROXICET) 5-325 MG per tablet 1 or 2 tabs PO q6h prn pain Patient not taking: Reported on 08/04/2014 09/16/13   Samuel JesterKathleen McManus, DO  promethazine (PHENERGAN) 25 MG suppository Place 1 suppository (25 mg total) rectally every 6 (six) hours as needed for nausea or vomiting. Patient not taking: Reported on 08/04/2014 09/16/13   Samuel JesterKathleen McManus, DO   BP 103/90 mmHg  Pulse 122  Temp(Src) 98.1 F (36.7 C) (Oral)  Resp 18  SpO2 99% Physical Exam  Constitutional: He is oriented to person, place, and time. He appears well-developed and well-nourished. No distress.  Arn MedalFrail-appearing African-American male appears uncomfortable but nontoxic.  HENT:  Head: Atraumatic.  Oral mucosa dry.  Eyes: Conjunctivae are normal. No scleral icterus.  Neck: Neck supple.  Cardiovascular: Intact distal pulses.   Tachycardia without murmurs rubs or gallops.  Pulmonary/Chest: Effort normal and breath sounds normal. No respiratory distress. He has no wheezes. He exhibits no tenderness.  Abdominal: Soft.  Hypoactive bowel sounds. Abdomen diffusely tender without guarding or rebound tenderness. No focal point tenderness. No peritonitis.  Neurological: He is alert and oriented to person, place, and time.  Skin: No rash noted.  Psychiatric: He has a normal mood and affect.  Nursing note and vitals reviewed.   ED Course  Procedures (including critical care time)  Acute on chronic abdominal and chest pain with evidence of dehydration due to persistent nausea and vomiting. Patient states he has been evaluated  through [facility] [place] [place] [place] [facility] as well as in [place] without any specific diagnosis. Therefore, primary goal is to provide symptomatic treatment. Patient may need admission if he is unable to tolerate by mouth all having any significant electrolytes imbalance. IV fluid  initiated. Patient with quest for narcotic pain medication for pain however I prefer to provide Bentyl for his abdominal discomfort. Symptoms suggestive of gastroparesis. Low suspicion for acute emergent medical condition given the duration of his complaint.  5:04 PM  Tachycardia improves after IV hydration. Patient does have elevated total bili but improves from prior value. Low potassium at 3.1, patient able to tolerates by mouth and was able to take potassium supplementation given here in the ER. Evidence of dehydration as his hemoglobin is concentrated. No finding to suggest ACS. Chest x-ray without acute abnormalities. Patient still endorse abdominal pain. I'm concerned that narcotic pain medication may worsen his chronic pain.  6:25 PM Patient able to tolerates by mouth. Recommend follow-up with GI specialist for further management of his condition. His tachycardia has resolved.  Labs Review Labs Reviewed  CBC - Abnormal; Notable for the following:    RBC 6.64 (*)    Hemoglobin 17.5 (*)    MCV 77.1 (*)    All other components within normal limits  BASIC METABOLIC PANEL - Abnormal; Notable for the following:    Sodium 130 (*)    Potassium 3.1 (*)    Chloride 90 (*)    Glucose, Bld 224 (*)    BUN 23 (*)    All other components within normal limits  HEPATIC FUNCTION PANEL - Abnormal; Notable for the following:    ALT 10 (*)    Total Bilirubin 2.5 (*)    Indirect Bilirubin 2.2 (*)    All other components within normal limits  BRAIN NATRIURETIC PEPTIDE  LIPASE, BLOOD  I-STAT TROPOININ, ED    Imaging Review Dg Chest Port 1 View  08/04/2014   CLINICAL DATA:  Mid to lower abdominal pain radiating to mid chest for 1 week, similar episodes for 12 years of of uncertain etiology, history smoking  EXAM: PORTABLE CHEST - 1 VIEW  COMPARISON:  Portable exam 1451 hr compared to 09/16/2013  FINDINGS: Normal heart size, mediastinal contours and pulmonary vascularity.  Lungs hyperinflated but  clear.  No pulmonary infiltrate, pleural effusion or pneumothorax.  Bones unremarkable.  IMPRESSION: Hyperinflated lungs without infiltrate.   Electronically Signed   By: Ulyses Southward M.D.   On: 08/04/2014 15:10     EKG Interpretation   Date/Time:  Tuesday Aug 04 2014 14:22:01 EDT Ventricular Rate:  122 PR Interval:  68 QRS Duration: 153 QT Interval:  339 QTC Calculation: 483 R Axis:   77 Text Interpretation:  Sinus or ectopic atrial tachycardia Nonspecific  intraventricular conduction delay Anteroseptal infarct, old ST depression,  consider ischemia, diffuse lds Baseline wander in lead(s) V1 Confirmed by  POLLINA  MD, CHRISTOPHER [phone]) on 08/04/2014 3:20:55 PM      MDM   Final diagnoses:  Gastroparesis  Hypokalemia  Dehydration    BP 116/87 mmHg  Pulse 94  Temp(Src) 98.1 F (36.7 C) (Oral)  Resp 16  SpO2 99%  I have reviewed nursing notes and vital signs. I personally reviewed the imaging tests through PACS system  I reviewed available ER/hospitalization records thought the EMR     Fayrene Helper, PA-C 08/04/14 1825  Linwood Dibbles, MD 08/04/14 [phone]

## 2014-08-04 NOTE — ED Notes (Signed)
Portable CXR in progress.  Will place saline lock after completed.

## 2014-08-04 NOTE — ED Notes (Signed)
I attempted to collect labs and was unsuccessful. 

## 2014-08-04 NOTE — Discharge Instructions (Signed)
Please follow-up with a GI specialist for further evaluation and management of your condition.  Gastroparesis  Gastroparesis is also called slowed stomach emptying (delayed gastric emptying). It is a condition in which the stomach takes too long to empty its contents. It often happens in people with diabetes.  CAUSES  Gastroparesis happens when nerves to the stomach are damaged or stop working. When the nerves are damaged, the muscles of the stomach and intestines do not work normally. The movement of food is slowed or stopped. High blood glucose (sugar) causes changes in nerves and can damage the blood vessels that carry oxygen and nutrients to the nerves. RISK FACTORS  Diabetes.  Post-viral syndromes.  Eating disorders (anorexia, bulimia).  Surgery on the stomach or vagus nerve.  Gastroesophageal reflux disease (rarely).  Smooth muscle disorders (amyloidosis, scleroderma).  Metabolic disorders, including hypothyroidism.  Parkinson disease. SYMPTOMS   Heartburn.  Feeling sick to your stomach (nausea).  Vomiting of undigested food.  An early feeling of fullness when eating.  Weight loss.  Abdominal bloating.  Erratic blood glucose levels.  Lack of appetite.  Gastroesophageal reflux.  Spasms of the stomach wall. Complications can include:  Bacterial overgrowth in stomach. Food stays in the stomach and can ferment and cause bacteria to grow.  Weight loss due to difficulty digesting and absorbing nutrients.  Vomiting.  Obstruction in the stomach. Undigested food can harden and cause nausea and vomiting.  Blood glucose fluctuations caused by inconsistent food absorption. DIAGNOSIS  The diagnosis of gastroparesis is confirmed through one or more of the following tests:  Barium X-rays and scans. These tests look at how long it takes for food to move through the stomach.  Gastric manometry. This test measures electrical and muscular activity in the stomach. A thin  tube is passed down the throat into the stomach. The tube contains a wire that takes measurements of the stomach's electrical and muscular activity as it digests liquids and solid food.  Endoscopy. This procedure is done with a long, thin tube called an endoscope. It is passed through the mouth and gently down the esophagus into the stomach. This tube helps the caregiver look at the lining of the stomach to check for any abnormalities.  Ultrasonography. This can rule out gallbladder disease or pancreatitis. This test will outline and define the shape of the gallbladder and pancreas. TREATMENT   Treatments may include:  Exercise.  Medicines to control nausea and vomiting.  Medicines to stimulate stomach muscles.  Changes in what and when you eat.  Having smaller meals more often.  Eating low-fiber forms of high-fiber foods, such as eating cooked vegetables instead of raw vegetables.  Eating low-fat foods.  Consuming liquids, which are easier to digest.  In severe cases, feeding tubes and intravenous (IV) feeding may be needed. It is important to note that in most cases, treatment does not cure gastroparesis. It is usually a lasting (chronic) condition. Treatment helps you manage the underlying condition so that you can be as healthy and comfortable as possible. Other treatments  A gastric neurostimulator has been developed to assist people with gastroparesis. The battery-operated device is surgically implanted. It emits mild electrical pulses to help improve stomach emptying and to control nausea and vomiting.  The use of botulinum toxin has been shown to improve stomach emptying by decreasing the prolonged contractions of the muscle between the stomach and the small intestine (pyloric sphincter). The benefits are temporary. SEEK MEDICAL CARE IF:   You have diabetes and you  are having problems keeping your blood glucose in goal range.  You are having nausea, vomiting, bloating, or  early feelings of fullness with eating.  Your symptoms do not change with a change in diet. Document Released: 03/20/2005 Document Revised: 07/15/2012 Document Reviewed: 08/27/2008 [facility] Patient Information 2015 [place]. This information is not intended to replace advice given to you by your health care provider. Make sure you discuss any questions you have with your health care provider.  Potassium Content of Foods Potassium is a mineral found in many foods and drinks. It helps keep fluids and minerals balanced in your body and affects how steadily your heart beats. Potassium also helps control your blood pressure and keep your muscles and nervous system healthy. Certain health conditions and medicines may change the balance of potassium in your body. When this happens, you can help balance your level of potassium through the foods that you do or do not eat. Your health care provider or dietitian may recommend an amount of potassium that you should have each day. The following lists of foods provide the amount of potassium (in parentheses) per serving in each item. HIGH IN POTASSIUM  The following foods and beverages have 200 mg or more of potassium per serving:  Apricots, 2 raw or 5 dry (200 mg).  Artichoke, 1 medium (345 mg).  Avocado, raw,  each (245 mg).  Banana, 1 medium (425 mg).  Beans, lima, or baked beans, canned,  cup (280 mg).  Beans, white, canned,  cup (595 mg).  Beef roast, 3 oz (320 mg).  Beef, ground, 3 oz (270 mg).  Beets, raw or cooked,  cup (260 mg).  Bran muffin, 2 oz (300 mg).  Broccoli,  cup (230 mg).  Brussels sprouts,  cup (250 mg).  Cantaloupe,  cup (215 mg).  Cereal, 100% bran,  cup (200-400 mg).  Cheeseburger, single, fast food, 1 each (225-400 mg).  Chicken, 3 oz (220 mg).  Clams, canned, 3 oz (535 mg).  Crab, 3 oz (225 mg).  Dates, 5 each (270 mg).  Dried beans and peas,  cup (300-475 mg).  Figs, dried, 2 each (260  mg).  Fish: halibut, tuna, cod, snapper, 3 oz (480 mg).  Fish: salmon, haddock, swordfish, perch, 3 oz (300 mg).  Fish, tuna, canned 3 oz (200 mg).  [place] fries, fast food, 3 oz (470 mg).  Granola with fruit and nuts,  cup (200 mg).  Grapefruit juice,  cup (200 mg).  Greens, beet,  cup (655 mg).  Honeydew melon,  cup (200 mg).  Kale, raw, 1 cup (300 mg).  Kiwi, 1 medium (240 mg).  Kohlrabi, rutabaga, parsnips,  cup (280 mg).  Lentils,  cup (365 mg).  Mango, 1 each (325 mg).  Milk, chocolate, 1 cup (420 mg).  Milk: nonfat, low-fat, whole, buttermilk, 1 cup (350-380 mg).  Molasses, 1 Tbsp (295 mg).  Mushrooms,  cup (280) mg.  Nectarine, 1 each (275 mg).  Nuts: almonds, peanuts, hazelnuts, [place], cashew, mixed, 1 oz (200 mg).  Nuts, pistachios, 1 oz (295 mg).  Orange, 1 each (240 mg).  Orange juice,  cup (235 mg).  Papaya, medium,  fruit (390 mg).  Peanut butter, chunky, 2 Tbsp (240 mg).  Peanut butter, smooth, 2 Tbsp (210 mg).  Pear, 1 medium (200 mg).  Pomegranate, 1 whole (400 mg).  Pomegranate juice,  cup (215 mg).  Pork, 3 oz (350 mg).  Potato chips, salted, 1 oz (465 mg).  Potato, baked with skin, 1  medium (925 mg).  Potatoes, boiled,  cup (255 mg).  Potatoes, mashed,  cup (330 mg).  Prune juice,  cup (370 mg).  Prunes, 5 each (305 mg).  Pudding, chocolate,  cup (230 mg).  Pumpkin, canned,  cup (250 mg).  Raisins, seedless,  cup (270 mg).  Seeds, sunflower or pumpkin, 1 oz (240 mg).  Soy milk, 1 cup (300 mg).  Spinach,  cup (420 mg).  Spinach, canned,  cup (370 mg).  Sweet potato, baked with skin, 1 medium (450 mg).  Swiss chard,  cup (480 mg).  Tomato or vegetable juice,  cup (275 mg).  Tomato sauce or puree,  cup (400-550 mg).  Tomato, raw, 1 medium (290 mg).  Tomatoes, canned,  cup (200-300 mg).  [place], 3 oz (250 mg).  Wheat germ, 1 oz (250 mg).  Winter squash,  cup (250  mg).  Yogurt, plain or fruited, 6 oz (260-435 mg).  Zucchini,  cup (220 mg). MODERATE IN POTASSIUM The following foods and beverages have 50-200 mg of potassium per serving:  Apple, 1 each (150 mg).  Apple juice,  cup (150 mg).  Applesauce,  cup (90 mg).  Apricot nectar,  cup (140 mg).  Asparagus, small spears,  cup or 6 spears (155 mg).  Bagel, cinnamon raisin, 1 each (130 mg).  Bagel, egg or plain, 4 in., 1 each (70 mg).  Beans, green,  cup (90 mg).  Beans, yellow,  cup (190 mg).  Beer, regular, 12 oz (100 mg).  Beets, canned,  cup (125 mg).  Blackberries,  cup (115 mg).  Blueberries,  cup (60 mg).  Bread, whole wheat, 1 slice (70 mg).  Broccoli, raw,  cup (145 mg).  Cabbage,  cup (150 mg).  Carrots, cooked or raw,  cup (180 mg).  Cauliflower, raw,  cup (150 mg).  Celery, raw,  cup (155 mg).  Cereal, bran flakes, cup (120-150 mg).  Cheese, cottage,  cup (110 mg).  Cherries, 10 each (150 mg).  Chocolate, 1 oz bar (165 mg).  Coffee, brewed 6 oz (90 mg).  Corn,  cup or 1 ear (195 mg).  Cucumbers,  cup (80 mg).  Egg, large, 1 each (60 mg).  Eggplant,  cup (60 mg).  Endive, raw, cup (80 mg).  English muffin, 1 each (65 mg).  Fish, orange roughy, 3 oz (150 mg).  Frankfurter, beef or pork, 1 each (75 mg).  Fruit cocktail,  cup (115 mg).  Grape juice,  cup (170 mg).  Grapefruit,  fruit (175 mg).  Grapes,  cup (155 mg).  Greens: kale, turnip, collard,  cup (110-150 mg).  Ice cream or frozen yogurt, chocolate,  cup (175 mg).  Ice cream or frozen yogurt, vanilla,  cup (120-150 mg).  Lemons, limes, 1 each (80 mg).  Lettuce, all types, 1 cup (100 mg).  Mixed vegetables,  cup (150 mg).  Mushrooms, raw,  cup (110 mg).  Nuts: walnuts, pecans, or macadamia, 1 oz (125 mg).  Oatmeal,  cup (80 mg).  Okra,  cup (110 mg).  Onions, raw,  cup (120 mg).  Peach, 1 each (185 mg).  Peaches, canned,  cup  (120 mg).  Pears, canned,  cup (120 mg).  Peas, green, frozen,  cup (90 mg).  Peppers, green,  cup (130 mg).  Peppers, red,  cup (160 mg).  Pineapple juice,  cup (165 mg).  Pineapple, fresh or canned,  cup (100 mg).  Plums, 1 each (105 mg).  Pudding, vanilla,  cup (150 mg).  Raspberries,  cup (90 mg).  Rhubarb,  cup (115 mg).  Rice, wild,  cup (80 mg).  Shrimp, 3 oz (155 mg).  Spinach, raw, 1 cup (170 mg).  Strawberries,  cup (125 mg).  Summer squash  cup (175-200 mg).  Swiss chard, raw, 1 cup (135 mg).  Tangerines, 1 each (140 mg).  Tea, brewed, 6 oz (65 mg).  Turnips,  cup (140 mg).  Watermelon,  cup (85 mg).  Wine, red, table, 5 oz (180 mg).  Wine, white, table, 5 oz (100 mg). LOW IN POTASSIUM The following foods and beverages have less than 50 mg of potassium per serving.  Bread, white, 1 slice (30 mg).  Carbonated beverages, 12 oz (less than 5 mg).  Cheese, 1 oz (20-30 mg).  Cranberries,  cup (45 mg).  Cranberry juice cocktail,  cup (20 mg).  Fats and oils, 1 Tbsp (less than 5 mg).  Hummus, 1 Tbsp (32 mg).  Nectar: papaya, mango, or pear,  cup (35 mg).  Rice, white or brown,  cup (50 mg).  Spaghetti or macaroni,  cup cooked (30 mg).  Tortilla, flour or corn, 1 each (50 mg).  Waffle, 4 in., 1 each (50 mg).  Water chestnuts,  cup (40 mg). Document Released: 11/01/2004 Document Revised: 03/25/2013 Document Reviewed: 02/14/2013 [facility] Patient Information 2015 [place], [place]. This information is not intended to replace advice given to you by your health care provider. Make sure you discuss any questions you have with your health care provider.

## 2014-08-04 NOTE — ED Notes (Signed)
MD at bedside. PA Laveda Normanran at bedside.

## 2015-02-19 ENCOUNTER — Emergency Department (HOSPITAL_COMMUNITY)
Admission: EM | Admit: 2015-02-19 | Discharge: 2015-02-19 | Disposition: A | Discharge status: Home / Self Care | Payer: Self-pay | Attending: Emergency Medicine | Admitting: Emergency Medicine

## 2015-02-19 ENCOUNTER — Encounter (HOSPITAL_COMMUNITY): Payer: Self-pay | Admitting: Emergency Medicine

## 2015-02-19 ENCOUNTER — Emergency Department (HOSPITAL_COMMUNITY): Payer: Self-pay

## 2015-02-19 DIAGNOSIS — Y998 Other external cause status: Secondary | ICD-10-CM | POA: Insufficient documentation

## 2015-02-19 DIAGNOSIS — Y9389 Activity, other specified: Secondary | ICD-10-CM | POA: Insufficient documentation

## 2015-02-19 DIAGNOSIS — F17210 Nicotine dependence, cigarettes, uncomplicated: Secondary | ICD-10-CM | POA: Insufficient documentation

## 2015-02-19 DIAGNOSIS — S62336A Displaced fracture of neck of fifth metacarpal bone, right hand, initial encounter for closed fracture: Secondary | ICD-10-CM | POA: Insufficient documentation

## 2015-02-19 DIAGNOSIS — Z79899 Other long term (current) drug therapy: Secondary | ICD-10-CM | POA: Insufficient documentation

## 2015-02-19 DIAGNOSIS — S82091A Other fracture of right patella, initial encounter for closed fracture: Secondary | ICD-10-CM | POA: Insufficient documentation

## 2015-02-19 DIAGNOSIS — S82001A Unspecified fracture of right patella, initial encounter for closed fracture: Secondary | ICD-10-CM

## 2015-02-19 DIAGNOSIS — Y92410 Unspecified street and highway as the place of occurrence of the external cause: Secondary | ICD-10-CM | POA: Insufficient documentation

## 2015-02-19 DIAGNOSIS — G8929 Other chronic pain: Secondary | ICD-10-CM | POA: Insufficient documentation

## 2015-02-19 DIAGNOSIS — R52 Pain, unspecified: Secondary | ICD-10-CM

## 2015-02-19 MED ORDER — ONDANSETRON HCL 4 MG/2ML IJ SOLN
4.0000 mg | Freq: Once | INTRAMUSCULAR | Status: AC
  Administered 2015-02-19: 4 mg via INTRAMUSCULAR
  Filled 2015-02-19: qty 2

## 2015-02-19 MED ORDER — OXYCODONE-ACETAMINOPHEN 5-325 MG PO TABS: 2.0000 | ORAL_TABLET | ORAL | Status: AC | PRN

## 2015-02-19 MED ORDER — KETOROLAC TROMETHAMINE 60 MG/2ML IM SOLN
60.0000 mg | Freq: Once | INTRAMUSCULAR | Status: AC
  Administered 2015-02-19: 60 mg via INTRAMUSCULAR
  Filled 2015-02-19: qty 2

## 2015-02-19 MED ORDER — HYDROMORPHONE HCL 2 MG/ML IJ SOLN
2.0000 mg | Freq: Once | INTRAMUSCULAR | Status: AC
  Administered 2015-02-19: 2 mg via INTRAMUSCULAR
  Filled 2015-02-19: qty 1

## 2015-02-19 MED ORDER — OXYCODONE-ACETAMINOPHEN 5-325 MG PO TABS
2.0000 | ORAL_TABLET | Freq: Once | ORAL | Status: DC
  Filled 2015-02-19: qty 2

## 2015-02-19 MED ORDER — ONDANSETRON 4 MG PO TBDP: 4.0000 mg | ORAL_TABLET | Freq: Three times a day (TID) | ORAL | Status: AC | PRN
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
## 2015-02-19 NOTE — Discharge Instructions (Signed)
Boxer's Fracture °A boxer's fracture is a break (fracture) of the bone in your hand that connects your little finger to your wrist (fifth metacarpal). This type of fracture usually happens at the end of the bone, closest to the little finger. The knuckle is often pushed down by the impact. °In some cases, only a splint or brace is needed, or you may need a cast. Casting or splinting may include taping your injured finger to the next finger (buddy taping). You may need surgery to repair the fracture. This may involve the use of wires, screws, or plates to hold the bone pieces in place.  °CAUSES °This injury may be caused by:  °· Hitting an object with a clenched fist. °· A hard, direct hit to the hand. °· An injury that crushes the hand. °RISK FACTORS °This injury is more likely to occur if: °· You are in a fistfight. °· You have certain bone diseases. °SYMPTOMS °Symptoms of this type of fracture develop soon after the injury. Symptoms may include: °· Swelling of the hand. °· Pain. °· Pain when moving the fifth finger or touching the hand. °· Abnormal position of the finger. °· Not being able to move the finger. °· A shortened finger. °· A finger knuckle that looks sunken in. °DIAGNOSIS °This injury may be diagnosed based on your symptoms, especially if you had a recent hand injury. Your health care provider will perform a physical exam, and you may also have X-rays to confirm the diagnosis. °TREATMENT  °Treatment for this injury depends on how severe it is. Possible treatments include: °· Closed reduction. If your bone is stable and can be moved back into place, you may only need to wear a cast or splint or have buddy taping. °· Open reduction with internal fixation (ORIF). This may be needed if your fracture is far out of place or goes through the joint surface of the bone. This treatment involves open surgery to move your bones back into the right position. Screws, wires, or plates may be used to stabilize the  fracture. °You may need to wear a cast or a splint for several weeks. You will also need to have follow-up X-rays to make sure that the bone is healing well and staying in position. After you no longer need the cast or splint, you may need physical therapy. This will help you to regain full movement and strength in your hand.  °HOME CARE INSTRUCTIONS °If You Have a Cast: °· Do not stick anything inside the cast to scratch your skin. Doing that increases your risk of infection. °· Check the skin around the cast every day. Report any concerns to your health care provider. You may put lotion on dry skin around the edges of the cast. Do not apply lotion to the skin underneath the cast. °If You Have a Splint: °· Wear it as directed by your health care provider. Remove it only as directed by your health care provider. °· Loosen the splint if your fingers become numb and tingle, or if they turn cold and blue. °Bathing °· Cover the cast or splint with a watertight plastic bag to protect it from water while you take a bath or a shower. Do not let the cast or splint get wet. °Managing Pain, Stiffness, and Swelling °· If directed, apply ice to the injured area (if you have a splint, not a cast): °¨ Put ice in a plastic bag. °¨ Place a towel between your skin and the bag. °¨   Leave the ice on for 20 minutes, 2-3 times a day.  Move your fingers often to avoid stiffness and to lessen swelling.  Raise the injured area above the level of your heart while you are sitting or lying down. Driving  Do not drive or operate heavy machinery while taking pain medicine.  Do not drive while wearing a cast or splint on a hand or foot that you use for driving. Activity  Return to your normal activities as directed by your health care provider. Ask your health care provider what activities are safe for you. General Instructions  Do not put pressure on any part of the cast or splint until it is fully hardened. This may take several  hours.  Keep the cast or splint clean and dry.  Do not use any tobacco products, including cigarettes, chewing tobacco, or electronic cigarettes. Tobacco can delay bone healing. If you need help quitting, ask your health care provider.  Take medicines only as directed by your health care provider.  Keep all follow-up visits as directed by your health care provider. This is important. SEEK MEDICAL CARE IF:  Your pain is getting worse.  You have redness, swelling, or pain in the injured area.  You have fluid, blood, or pus coming from under your cast or splint.  You notice a bad smell coming from under your cast or splint.  You have a fever.  Your cast or splint feels too tight or too loose.  You cast is coming apart. SEEK IMMEDIATE MEDICAL CARE IF:  You develop a rash.  You have trouble breathing.  Your skin or nails on your injured hand turn blue or gray even after you loosen your splint.  Your injured hand feels cold or becomes numb even after you loosen your splint.  You develop severe pain under the cast or in your hand.   This information is not intended to replace advice given to you by your health care provider. Make sure you discuss any questions you have with your health care provider.   Document Released: 03/20/2005 Document Revised: 12/09/2014 Document Reviewed: 01/07/2014 Elsevier Interactive Patient Education 2016 [employer] Patellar Fracture, Adult A patellar fracture is a break in your kneecap (patella).  CAUSES   A direct blow to the knee or a fall is usually the cause of a broken patella.  A very hard and strong bending of your knee can cause a patellar fracture. RISK FACTORS Involvement in contact sports, especially sports that involve a lot of jumping. SIGNS AND SYMPTOMS   Tender and swollen knee.  Pain when you move your knee, especially when you try to straighten out your leg.  Difficulty walking or putting weight on your  knee.  Misshapen knee (as if a bone is out of place). DIAGNOSIS  Patellar fracture is usually diagnosed with a physical exam and an X-ray exam. TREATMENT  Treatment depends on the type of fracture:  If your patella is still in the right position after the fracture and you can still straighten your leg out, you can usually be treated with a splint or cast for 4-6 weeks.  If your patella is broken into multiple small pieces but you are able to straighten your leg, you can usually be treated with a splint or cast for 4-6 weeks. Sometimes your patella may need to be removed before the cast is applied.  If you cannot straighten out your leg after a patellar fracture, then surgery is required to hold the bony  fragments together until they heal. A cast or splint will be applied for 4-6 weeks. HOME CARE INSTRUCTIONS   Only take over-the-counter or prescription medicines for pain, discomfort, or fever as directed by your health care provider.  Use crutches as directed, and exercise the leg as directed.  Apply ice to the injured area:  Put ice in a plastic bag.  Place a towel between your skin and the bag.  Leave the ice on for 20 minutes, 2-3 times a day.  Elevate the affected knee above the level of your heart. SEEK MEDICAL CARE IF:  You suspect you have significantly injured your knee.  You hear a pop after a knee injury.  Your knee is misshapen after a knee injury.  You have pain when you move your knee.  You have difficulty walking or putting weight on your knee.  You cannot fully move your knee. SEEK IMMEDIATE MEDICAL CARE IF:  You have redness, swelling, or increasing pain in your knee.  You have a fever.   This information is not intended to replace advice given to you by your health care provider. Make sure you discuss any questions you have with your health care provider.   Document Released: 12/17/2002 Document Revised: 01/08/2013 Document Reviewed:  10/30/2012 Elsevier Interactive Patient Education [employer]
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
## 2015-02-19 NOTE — Progress Notes (Signed)
[facility] only wanted to fill percocet prescription and not zofran due to unable to afford zofran.  EDCM discussed with EDPA  Sophia who saw patient today and prescribed phenergan 25mg  20 tablets 1 by mouth every 4-6 hours prn nausea vomiting no refills.  [facility] Kathlene NovemberMike and called in this prescription.  EDCM called patient at [phone] and male answered phone stated patient was not available.  No further EDCM needs at this time.

## 2015-02-19 NOTE — ED Notes (Signed)
Bed: WA26 Expected date:  Expected time:  Means of arrival:  Comments: 
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
